# Patient Record
Sex: Male | Born: 1945 | Race: White | Hispanic: No | Marital: Married | Smoking: Never smoker
Health system: Southern US, Community
[De-identification: ages and names within clinical notes are randomized; demographics above are authoritative.]

## PROBLEM LIST (undated history)

## (undated) DIAGNOSIS — N19 Unspecified kidney failure: Secondary | ICD-10-CM

## (undated) DIAGNOSIS — I509 Heart failure, unspecified: Secondary | ICD-10-CM

## (undated) DIAGNOSIS — I10 Essential (primary) hypertension: Secondary | ICD-10-CM

## (undated) DIAGNOSIS — E119 Type 2 diabetes mellitus without complications: Secondary | ICD-10-CM

---

## 2018-06-03 ENCOUNTER — Ambulatory Visit: Payer: Medicare Other | Admitting: Podiatry

## 2018-07-19 ENCOUNTER — Encounter: Payer: Self-pay | Admitting: Podiatry

## 2018-07-19 ENCOUNTER — Ambulatory Visit (INDEPENDENT_AMBULATORY_CARE_PROVIDER_SITE_OTHER): Payer: Medicare Other | Admitting: Podiatry

## 2018-07-19 ENCOUNTER — Other Ambulatory Visit: Payer: Self-pay

## 2018-07-19 DIAGNOSIS — E119 Type 2 diabetes mellitus without complications: Secondary | ICD-10-CM | POA: Diagnosis not present

## 2018-07-19 DIAGNOSIS — M79675 Pain in left toe(s): Secondary | ICD-10-CM | POA: Diagnosis not present

## 2018-07-19 DIAGNOSIS — M79674 Pain in right toe(s): Secondary | ICD-10-CM

## 2018-07-19 DIAGNOSIS — B351 Tinea unguium: Secondary | ICD-10-CM | POA: Diagnosis not present

## 2018-07-19 NOTE — Progress Notes (Signed)
This patient presents to the office with chief complaint of long thick nails and diabetic feet.  This patient  says there  is  no pain and discomfort in his  feet.  This patient says there are long thick painful both big toenails  He has history of injuring both great toenails.  These nails are painful walking and wearing shoes.  Patient has no history of infection or drainage from both feet.  Patient is unable to  self treat his own nails . This patient presents  to the office today for treatment of the  long nails and a foot evaluation due to history of  diabetes.  General Appearance  Alert, conversant and in no acute stress.  Vascular  Dorsalis pedis and posterior tibial  pulses are palpable  bilaterally.  Capillary return is within normal limits  bilaterally. Temperature is within normal limits  bilaterally.  Neurologic  Senn-Weinstein monofilament wire test within normal limits  bilaterally. Muscle power within normal limits bilaterally.  Nails Thick disfigured discolored nails with subungual debris  from hallux to fifth toes bilaterally. No evidence of bacterial infection or drainage bilaterally.  Orthopedic  No limitations of motion of motion feet .  No crepitus or effusions noted.  No bony pathology or digital deformities noted.  Hallux limitus 1st MPJ  B/L.  Skin  normotropic skin with no porokeratosis noted bilaterally.  No signs of infections or ulcers noted.     Onychomycosis  Diabetes with no foot complications  IE  Debride nails x 2.  A diabetic foot exam was performed and there is no evidence of any vascular or neurologic pathology.   RTC 3 months.   Helane Gunther DPM

## 2018-07-23 ENCOUNTER — Encounter

## 2018-07-23 ENCOUNTER — Ambulatory Visit: Payer: Medicare Other | Admitting: Podiatry

## 2018-10-23 ENCOUNTER — Ambulatory Visit: Payer: Medicare Other | Admitting: Podiatry

## 2018-12-20 ENCOUNTER — Ambulatory Visit: Payer: Medicare Other | Admitting: Podiatry

## 2019-03-21 ENCOUNTER — Ambulatory Visit: Payer: Medicare Other | Admitting: Podiatry

## 2021-08-16 ENCOUNTER — Ambulatory Visit (INDEPENDENT_AMBULATORY_CARE_PROVIDER_SITE_OTHER): Payer: Medicare Other | Admitting: Internal Medicine

## 2021-08-16 ENCOUNTER — Encounter: Payer: Self-pay | Admitting: Internal Medicine

## 2021-08-16 VITALS — BP 132/84 | HR 77 | Ht 68.0 in | Wt 209.2 lb

## 2021-08-16 DIAGNOSIS — I4891 Unspecified atrial fibrillation: Secondary | ICD-10-CM

## 2021-08-16 NOTE — Progress Notes (Signed)
?Cardiology Office Note:   ? ?Date:  08/16/2021  ? ?ID:  Leon Dennis, DOB Sep 09, 1945, MRN [id number] ? ?PCP:  Pcp, No ?  ?[facility] Providers ?Cardiologist:  None    ? ?Referring MD: Allie Dimmer, MD  ? ?No chief complaint on file. ?Afib ? ?History of Present Illness:   ? ?Leon Dennis is a 76 y.o. male with a hx of right sided heart failure, CKD Stage 3a, T2DM,  referral for afib ? ?Saw her PCP 2/28 with c/f PNA. Had bloating. His EKG showed afib, which is a new diagnosis. He was started on eliquis.  She also started him on lasix. He has RV failure with significant pulmonary HTN. ? ?Saw cardiology at [facility]. She saw Dr. Dianah Field for afib. No detailed note. ? ?He went to the ED 4/11 and saw his PCP Dr. Raquel Sarna on 4/13. He had LE edema and weight gain of 4 pounds. She the lasix that was started. The lasix showed some improvement. He continued to have abdominal distension. He has persistent DOE. ? ?He comes in today feeling tired and SOB. No orthopnea or PND. Continues to have abdominal edema. Still has pitting edema. This is challenging to manage at home. He is concerned about increasing his lasix with frequent urination. He's on room air. Not in acute distress. ? ? ?No COPD. No lung dx. No PE. He is planned for a sleep study he believes ? ? ? ?[employer] ?08/03/2021 Cxray ?Persistent small right pleural effusion ? ? ?TTE  08/04/2021 ?EF 45-50 ?Moderately enlarged RV ?Severely Dilate RA ?RVSP 65  mmHg  ?Moderately dilated LA ?Mild AS ?Mild MR ?E/e' 18 ? ?07/31/2021 ?CXRAY infiltrates -right mid/lower lung fields ? ?CTA Abd/pelvis  ?CAC ? Small to moderate R pleural effusions. Compressive atelectasis of the right lung. Small ascites ? ?Current Medications: ?Current Outpatient Medications on File Prior to Visit  ?Medication Sig Dispense Refill  ? amLODipine (NORVASC) 10 MG tablet     ? busPIRone (BUSPAR) 5 MG tablet Take by mouth.    ? dapagliflozin propanediol (FARXIGA) 5 MG TABS tablet Take by  mouth.    ? furosemide (LASIX) 40 MG tablet Take 40 mg by mouth daily.    ? glimepiride (AMARYL) 4 MG tablet     ? glucose blood test strip 1 Strip by external route two times daily Dx: E11.65 uncontrolled Type 2 diabetes ?Dispense Freestyle Insulinx test strips    ? hydrALAZINE (APRESOLINE) 25 MG tablet TAKE (1) TABLET TWICE DAILY.    ? lisinopril (ZESTRIL) 40 MG tablet Take 40 mg by mouth daily.    ? magnesium chloride (SLOW-MAG) 64 MG TBEC SR tablet Take by mouth.    ? metoprolol tartrate (LOPRESSOR) 100 MG tablet TAKE (1) TABLET TWICE DAILY.    ? potassium chloride (KLOR-CON M) 10 MEQ tablet Take 10 mEq by mouth daily.    ? ?No current facility-administered medications on file prior to visit.  ? ? ? ?Allergies:   Patient has no known allergies.  ? ?Social History  ? ?Socioeconomic History  ? Marital status: Married  ?  Spouse name: Not on file  ? Number of children: Not on file  ? Years of education: Not on file  ? Highest education level: Not on file  ?Occupational History  ? Not on file  ?Tobacco Use  ? Smoking status: Never  ? Smokeless tobacco: Never  ?Substance and Sexual Activity  ? Alcohol use: Yes  ?  Comment: [email or web address]  ? Drug  use: Never  ? Sexual activity: Not on file  ?Other Topics Concern  ? Not on file  ?Social History Narrative  ? Not on file  ? ?Social Determinants of Health  ? ?Financial Resource Strain: Not on file  ?Food Insecurity: Not on file  ?Transportation Needs: Not on file  ?Physical Activity: Not on file  ?Stress: Not on file  ?Social Connections: Not on file  ?  ? ?Family History: ?The patient's reviewed, not pertinent ? ?ROS:   ?Please see the history of present illness.    ? All other systems reviewed and are negative. ? ?EKGs/Labs/Other Studies Reviewed:   ? ?The following studies were reviewed today: ? ? ?EKG:  EKG is  ordered today.  The ekg ordered today demonstrates  ? ?Afib - rates 70s ? ?Recent Labs: ?No results found for requested labs within last 8760 hours.  ?Recent Lipid  Panel ?No results found for: CHOL, TRIG, HDL, CHOLHDL, VLDL, LDLCALC, LDLDIRECT ? ? ?Risk Assessment/Calculations:   ? ?CHA2DS2-VASc Score = 5  ? This indicates a 7.2% annual risk of stroke. ?The patient's score is based upon: ?CHF History: 1 ?HTN History: 1 ?Diabetes History: 1 ?Stroke History: 0 ?Vascular Disease History: 0 ?Age Score: 2 ?Gender Score: 0 ?  ? ? ?    ? ?Physical Exam:   ? ?VS:  BP 132/84   Pulse 77   Ht 5\' 8"  (1.727 m)   Wt 209 lb 3.2 oz (94.9 kg)   SpO2 92%   BMI 31.81 kg/m?    ? ?Wt Readings from Last 3 Encounters:  ?08/16/21 209 lb 3.2 oz (94.9 kg)  ?  ?Vitals:  ? 08/16/21 1123  ?BP: 132/84  ?Pulse: 77  ?SpO2: 92%  ? ? ? ?GEN: Well nourished, no acute distress ?HEENT: Normal ?NECK: mild elevation JVD; No carotid bruits ?LYMPHATICS: No lymphadenopathy ?CARDIAC: irregularly irregular, no murmurs, rubs, gallops ?RESPIRATORY:  Nl wob, crackles ?ABDOMEN: Soft, non-tender, non-distended ?MUSCULOSKELETAL:  No edema; No deformity  ?SKIN: Warm and dry ?NEUROLOGIC:  Alert and oriented x 3 ?PSYCHIATRIC:  Normal affect  ? ?ASSESSMENT:   ? ?HFpEF: decompensation. He has significant pulmonary HTN with RV dysfunction. Challenging to manage as an outpatient. He's been on a diuretic persistently with some improvement, but still symptomatic w/ gut edema. Could benefit from IV lasix. I recommended that he go to the ED with his wife for admission.   ?- continue current lasix 40 mg daily ? ?Pulmonary HTN: Can consider further w/u with PFTs. Has Group II component. No known lung disease, no hx of PE. Planned for home sleep study per patient.  ? ?Afib: Paroxysmal. New onset. He's been on eliquis since 2/28, no missed doses. I recommend DCCV once he is euvolemic. ?- continue eliquis ?- cont metop ? ?HTN- continue lisinopril 20 mg daily, norvasc 10 mg daily, metop 100 mg tartrate BID, hydralazine 25 mg BID.  ? ?PLAN:   ? ?In order of problems listed above: ? ?Recommend admission, they wanted to decide on the day.   ?Follow up in [place] after admission ? ?   ? ?  ?Medication Adjustments/Labs and Tests Ordered: ?Current medicines are reviewed at length with the patient today.  Concerns regarding medicines are outlined above.  ?Orders Placed This Encounter  ?Procedures  ? EKG 12-Lead  ? ?No orders of the defined types were placed in this encounter. ? ? ?Patient Instructions  ?Medication Instructions:  ?Your Physician recommend you continue on your current medication as directed.   ? ?*  If you need a refill on your cardiac medications before your next appointment, please call your pharmacy* ? ? ?Lab Work: ?None ordered today ? ? ?Testing/Procedures: ?None ordered today ? ? ?Follow-Up: ?At [facility], you and your health needs are our priority.  As part of our continuing mission to provide you with exceptional heart care, we have created designated Provider Care Teams.  These Care Teams include your primary Cardiologist (physician) and Advanced Practice Providers (APPs -  Physician Assistants and Nurse Practitioners) who all work together to provide you with the care you need, when you need it. ? ?We recommend signing up for the patient portal called "MyChart".  Sign up information is provided on this After Visit Summary.  MyChart is used to connect with patients for Virtual Visits (Telemedicine).  Patients are able to view lab/test results, encounter notes, upcoming appointments, etc.  Non-urgent messages can be sent to your provider as well.   ?To learn more about what you can do with MyChart, go to [email or web address].   ? ?Your next appointment:   ?1 month(s) ? ?The format for your next appointment:   ?In Person ? ?Provider: ?Carlyle Dolly, MD   ? ? ?Important Information About Sugar ? ? ? ? ? ?  ? ?Signed, ?Janina Mayo, MD  ?08/16/2021 3:22 PM    ?[place] ?

## 2021-08-16 NOTE — Patient Instructions (Addendum)
Medication Instructions:  ?Your Physician recommend you continue on your current medication as directed.   ? ?*If you need a refill on your cardiac medications before your next appointment, please call your pharmacy* ? ? ?Lab Work: ?None ordered today ? ? ?Testing/Procedures: ?None ordered today ? ? ?Follow-Up: ?At [facility], you and your health needs are our priority.  As part of our continuing mission to provide you with exceptional heart care, we have created designated Provider Care Teams.  These Care Teams include your primary Cardiologist (physician) and Advanced Practice Providers (APPs -  Physician Assistants and Nurse Practitioners) who all work together to provide you with the care you need, when you need it. ? ?We recommend signing up for the patient portal called "MyChart".  Sign up information is provided on this After Visit Summary.  MyChart is used to connect with patients for Virtual Visits (Telemedicine).  Patients are able to view lab/test results, encounter notes, upcoming appointments, etc.  Non-urgent messages can be sent to your provider as well.   ?To learn more about what you can do with MyChart, go to [email or web address].   ? ?Your next appointment:   ?1 month(s) ? ?The format for your next appointment:   ?In Person ? ?Provider: ?Dina Rich, MD   ? ? ?Important Information About Sugar ? ? ? ? ? ? ?

## 2021-08-17 ENCOUNTER — Telehealth: Payer: Self-pay | Admitting: Internal Medicine

## 2021-08-17 NOTE — Telephone Encounter (Signed)
Spoke with bed control- pt placed on list for direct admit. Per bed control pt may get a bed today but is more hopeful for tomorrow. ? ?Called pt's wife, Natalia Leatherwood (ok per DPR) back and gave her this information. Made wife aware that if pt's condition gets worst and they choose to go to the ED then that will negate the direct admit. ED precautions reviewed with wife. Wife will give our office a call in the morning to check on status of bed. Wife verbalizes understanding.  ?

## 2021-08-17 NOTE — Telephone Encounter (Signed)
Follow Up: ? ? ? ? ?Patient's wife called, she said she was waiting to hear something. Please give her a call. ?

## 2021-08-17 NOTE — Telephone Encounter (Signed)
Patient wife called stating she was told yesterday at his visit by Dr. Tereso Newcomer, he is to go to the emergency room tomorrow Thursday 4/20 to be admitted.   She wants to make sure he doesn't need to go through the emergency room process to be admitted.  ?

## 2021-08-18 ENCOUNTER — Inpatient Hospital Stay (HOSPITAL_COMMUNITY)
Admission: AD | Admit: 2021-08-18 | Discharge: 2021-08-22 | DRG: 291 | Disposition: A | Discharge status: Home / Self Care | Payer: Medicare Other | Source: Ambulatory Visit | Attending: Cardiology | Admitting: Cardiology

## 2021-08-18 ENCOUNTER — Inpatient Hospital Stay (HOSPITAL_COMMUNITY): Payer: Medicare Other

## 2021-08-18 DIAGNOSIS — G4733 Obstructive sleep apnea (adult) (pediatric): Secondary | ICD-10-CM | POA: Diagnosis present

## 2021-08-18 DIAGNOSIS — I5031 Acute diastolic (congestive) heart failure: Secondary | ICD-10-CM | POA: Diagnosis present

## 2021-08-18 DIAGNOSIS — E1122 Type 2 diabetes mellitus with diabetic chronic kidney disease: Secondary | ICD-10-CM | POA: Diagnosis present

## 2021-08-18 DIAGNOSIS — I272 Pulmonary hypertension, unspecified: Secondary | ICD-10-CM | POA: Diagnosis not present

## 2021-08-18 DIAGNOSIS — I2721 Secondary pulmonary arterial hypertension: Secondary | ICD-10-CM | POA: Diagnosis present

## 2021-08-18 DIAGNOSIS — I50813 Acute on chronic right heart failure: Secondary | ICD-10-CM

## 2021-08-18 DIAGNOSIS — E876 Hypokalemia: Secondary | ICD-10-CM | POA: Diagnosis present

## 2021-08-18 DIAGNOSIS — I50810 Right heart failure, unspecified: Secondary | ICD-10-CM | POA: Diagnosis present

## 2021-08-18 DIAGNOSIS — I480 Paroxysmal atrial fibrillation: Secondary | ICD-10-CM

## 2021-08-18 DIAGNOSIS — N1831 Chronic kidney disease, stage 3a: Secondary | ICD-10-CM | POA: Diagnosis present

## 2021-08-18 DIAGNOSIS — I4819 Other persistent atrial fibrillation: Secondary | ICD-10-CM | POA: Diagnosis present

## 2021-08-18 DIAGNOSIS — I4891 Unspecified atrial fibrillation: Secondary | ICD-10-CM | POA: Diagnosis not present

## 2021-08-18 DIAGNOSIS — R350 Frequency of micturition: Secondary | ICD-10-CM | POA: Diagnosis present

## 2021-08-18 DIAGNOSIS — F419 Anxiety disorder, unspecified: Secondary | ICD-10-CM | POA: Diagnosis present

## 2021-08-18 DIAGNOSIS — I5082 Biventricular heart failure: Secondary | ICD-10-CM | POA: Diagnosis present

## 2021-08-18 DIAGNOSIS — Z79899 Other long term (current) drug therapy: Secondary | ICD-10-CM

## 2021-08-18 DIAGNOSIS — Z7901 Long term (current) use of anticoagulants: Secondary | ICD-10-CM

## 2021-08-18 DIAGNOSIS — I130 Hypertensive heart and chronic kidney disease with heart failure and stage 1 through stage 4 chronic kidney disease, or unspecified chronic kidney disease: Secondary | ICD-10-CM | POA: Diagnosis present

## 2021-08-18 DIAGNOSIS — I10 Essential (primary) hypertension: Secondary | ICD-10-CM

## 2021-08-18 DIAGNOSIS — G4710 Hypersomnia, unspecified: Secondary | ICD-10-CM | POA: Diagnosis present

## 2021-08-18 DIAGNOSIS — I2781 Cor pulmonale (chronic): Secondary | ICD-10-CM | POA: Diagnosis present

## 2021-08-18 DIAGNOSIS — G4700 Insomnia, unspecified: Secondary | ICD-10-CM | POA: Diagnosis present

## 2021-08-18 DIAGNOSIS — R0609 Other forms of dyspnea: Secondary | ICD-10-CM | POA: Diagnosis not present

## 2021-08-18 DIAGNOSIS — E119 Type 2 diabetes mellitus without complications: Secondary | ICD-10-CM | POA: Diagnosis not present

## 2021-08-18 HISTORY — DX: Type 2 diabetes mellitus without complications: E11.9

## 2021-08-18 LAB — ECHOCARDIOGRAM COMPLETE
Calc EF: 66.6 %
Height: 68 in
S' Lateral: 2.62 cm
Single Plane A2C EF: 54.9 %
Single Plane A4C EF: 74 %
Weight: 3269.86 oz

## 2021-08-18 LAB — CBC WITH DIFFERENTIAL/PLATELET
Abs Immature Granulocytes: 0.02 10*3/uL (ref 0.00–0.07)
Basophils Absolute: 0.1 10*3/uL (ref 0.0–0.1)
Basophils Relative: 1 %
Eosinophils Absolute: 0.1 10*3/uL (ref 0.0–0.5)
Eosinophils Relative: 1 %
HCT: 37.3 % — ABNORMAL LOW (ref 39.0–52.0)
Hemoglobin: 12.5 g/dL — ABNORMAL LOW (ref 13.0–17.0)
Immature Granulocytes: 0 %
Lymphocytes Relative: 15 %
Lymphs Abs: 0.8 10*3/uL (ref 0.7–4.0)
MCH: 29.6 pg (ref 26.0–34.0)
MCHC: 33.5 g/dL (ref 30.0–36.0)
MCV: 88.2 fL (ref 80.0–100.0)
Monocytes Absolute: 0.7 10*3/uL (ref 0.1–1.0)
Monocytes Relative: 13 %
Neutro Abs: 3.8 10*3/uL (ref 1.7–7.7)
Neutrophils Relative %: 70 %
Platelets: 183 10*3/uL (ref 150–400)
RBC: 4.23 MIL/uL (ref 4.22–5.81)
RDW: 14.9 % (ref 11.5–15.5)
WBC: 5.5 10*3/uL (ref 4.0–10.5)
nRBC: 0 % (ref 0.0–0.2)

## 2021-08-18 LAB — COMPREHENSIVE METABOLIC PANEL
ALT: 13 U/L (ref 0–44)
AST: 21 U/L (ref 15–41)
Albumin: 3.5 g/dL (ref 3.5–5.0)
Alkaline Phosphatase: 58 U/L (ref 38–126)
Anion gap: 8 (ref 5–15)
BUN: 21 mg/dL (ref 8–23)
CO2: 24 mmol/L (ref 22–32)
Calcium: 8.7 mg/dL — ABNORMAL LOW (ref 8.9–10.3)
Chloride: 104 mmol/L (ref 98–111)
Creatinine, Ser: 1.52 mg/dL — ABNORMAL HIGH (ref 0.61–1.24)
GFR, Estimated: 47 mL/min — ABNORMAL LOW (ref >60)
Glucose, Bld: 272 mg/dL — ABNORMAL HIGH (ref 70–99)
Potassium: 3.1 mmol/L — ABNORMAL LOW (ref 3.5–5.1)
Sodium: 136 mmol/L (ref 135–145)
Total Bilirubin: 1.2 mg/dL (ref 0.3–1.2)
Total Protein: 6.5 g/dL (ref 6.5–8.1)

## 2021-08-18 LAB — GLUCOSE, CAPILLARY: Glucose-Capillary: 204 mg/dL — ABNORMAL HIGH (ref 70–99)

## 2021-08-18 LAB — TSH: TSH: 2.788 u[IU]/mL (ref 0.350–4.500)

## 2021-08-18 LAB — BRAIN NATRIURETIC PEPTIDE: B Natriuretic Peptide: 1394 pg/mL — ABNORMAL HIGH (ref 0.0–100.0)

## 2021-08-18 LAB — MAGNESIUM: Magnesium: 2 mg/dL (ref 1.7–2.4)

## 2021-08-18 LAB — HEMOGLOBIN A1C
Hgb A1c MFr Bld: 7.3 % — ABNORMAL HIGH (ref 4.8–5.6)
Mean Plasma Glucose: 162.81 mg/dL

## 2021-08-18 LAB — D-DIMER, QUANTITATIVE: D-Dimer, Quant: 2.81 ug/mL-FEU — ABNORMAL HIGH (ref 0.00–0.50)

## 2021-08-18 MED ORDER — ACETAMINOPHEN 325 MG PO TABS: 650.0000 mg | ORAL_TABLET | ORAL | Status: DC | PRN

## 2021-08-18 MED ORDER — FUROSEMIDE 10 MG/ML IJ SOLN
60.0000 mg | Freq: Two times a day (BID) | INTRAMUSCULAR | Status: DC
  Administered 2021-08-18 – 2021-08-22 (×8): 60 mg via INTRAVENOUS
  Filled 2021-08-18 (×8): qty 6

## 2021-08-18 MED ORDER — METOPROLOL TARTRATE 100 MG PO TABS
100.0000 mg | ORAL_TABLET | Freq: Two times a day (BID) | ORAL | Status: DC
  Administered 2021-08-18 – 2021-08-22 (×8): 100 mg via ORAL
  Filled 2021-08-18 (×8): qty 1

## 2021-08-18 MED ORDER — INSULIN ASPART 100 UNIT/ML IJ SOLN
0.0000 [IU] | Freq: Every day | INTRAMUSCULAR | Status: DC
  Administered 2021-08-18: 2 [IU] via SUBCUTANEOUS

## 2021-08-18 MED ORDER — ONDANSETRON HCL 4 MG/2ML IJ SOLN: 4.0000 mg | Freq: Four times a day (QID) | INTRAMUSCULAR | Status: DC | PRN

## 2021-08-18 MED ORDER — MELATONIN 5 MG PO TABS
5.0000 mg | ORAL_TABLET | Freq: Every evening | ORAL | Status: DC | PRN
  Administered 2021-08-18: 5 mg via ORAL
  Filled 2021-08-18 (×3): qty 1

## 2021-08-18 MED ORDER — ACETAMINOPHEN 500 MG PO TABS
1000.0000 mg | ORAL_TABLET | ORAL | Status: DC | PRN
  Administered 2021-08-19 (×2): 1000 mg via ORAL
  Filled 2021-08-18 (×2): qty 2

## 2021-08-18 MED ORDER — BUSPIRONE HCL 5 MG PO TABS
5.0000 mg | ORAL_TABLET | Freq: Three times a day (TID) | ORAL | Status: DC
  Administered 2021-08-18 – 2021-08-22 (×12): 5 mg via ORAL
  Filled 2021-08-18 (×12): qty 1

## 2021-08-18 MED ORDER — AMLODIPINE BESYLATE 10 MG PO TABS
10.0000 mg | ORAL_TABLET | Freq: Every day | ORAL | Status: DC
Start: 2021-08-19 — End: 2021-08-22
  Administered 2021-08-19 – 2021-08-22 (×4): 10 mg via ORAL
  Filled 2021-08-18 (×4): qty 1

## 2021-08-18 MED ORDER — INSULIN ASPART 100 UNIT/ML IJ SOLN
0.0000 [IU] | Freq: Three times a day (TID) | INTRAMUSCULAR | Status: DC
  Administered 2021-08-19: 2 [IU] via SUBCUTANEOUS
  Administered 2021-08-19: 5 [IU] via SUBCUTANEOUS
  Administered 2021-08-20 (×2): 3 [IU] via SUBCUTANEOUS
  Administered 2021-08-21: 2 [IU] via SUBCUTANEOUS
  Administered 2021-08-21: 8 [IU] via SUBCUTANEOUS
  Administered 2021-08-22: 2 [IU] via SUBCUTANEOUS
  Administered 2021-08-22: 5 [IU] via SUBCUTANEOUS

## 2021-08-18 MED ORDER — POTASSIUM CHLORIDE CRYS ER 20 MEQ PO TBCR
40.0000 meq | EXTENDED_RELEASE_TABLET | Freq: Two times a day (BID) | ORAL | Status: DC
  Administered 2021-08-18 – 2021-08-22 (×8): 40 meq via ORAL
  Filled 2021-08-18 (×8): qty 2

## 2021-08-18 MED ORDER — HYDRALAZINE HCL 25 MG PO TABS
25.0000 mg | ORAL_TABLET | Freq: Two times a day (BID) | ORAL | Status: DC
  Administered 2021-08-18 – 2021-08-20 (×5): 25 mg via ORAL
  Filled 2021-08-18 (×5): qty 1

## 2021-08-18 MED ORDER — DAPAGLIFLOZIN PROPANEDIOL 5 MG PO TABS
5.0000 mg | ORAL_TABLET | Freq: Every day | ORAL | Status: DC
  Administered 2021-08-19 – 2021-08-22 (×4): 5 mg via ORAL
  Filled 2021-08-18 (×4): qty 1

## 2021-08-18 MED ORDER — APIXABAN 5 MG PO TABS
5.0000 mg | ORAL_TABLET | Freq: Two times a day (BID) | ORAL | Status: DC
  Administered 2021-08-18 – 2021-08-21 (×7): 5 mg via ORAL
  Filled 2021-08-18 (×7): qty 1

## 2021-08-18 MED ORDER — LISINOPRIL 20 MG PO TABS
40.0000 mg | ORAL_TABLET | Freq: Every day | ORAL | Status: DC
  Administered 2021-08-19 – 2021-08-22 (×4): 40 mg via ORAL
  Filled 2021-08-18 (×4): qty 2

## 2021-08-18 NOTE — TOC Progression Note (Signed)
Transition of Care (TOC) - Progression Note  ? ? ?Patient Details  ?Name: Leon Dennis ?MRN: [id number] ?Date of Birth: Sep 30, 1945 ? ?Transition of Care (TOC) CM/SW Contact  ?Leone Haven, RN ?Phone Number: ?08/18/2021, 3:04 PM ? ?Clinical Narrative:    ? from home with wife, CHF, afib ,pulmonary HTN, conts on iv lasix, pta eliquis. TOC will continue to follow for dc needs.  ? ? ?  ?  ? ?Expected Discharge Plan and Services ?  ?  ?  ?  ?  ?                ?  ?  ?  ?  ?  ?  ?  ?  ?  ?  ? ? ?Social Determinants of Health (SDOH) Interventions ?  ? ?Readmission Risk Interventions ?   ? View : No data to display.  ?  ?  ?  ? ? ?

## 2021-08-18 NOTE — H&P (Addendum)
?Cardiology Admission History and Physical:  ? ?Patient ID: Leon Dennis ?MRN: [id number]; DOB: [birth year]  ? ?Admission date: 08/18/2021 ? ?PCP:  Pcp, No ?  ?[facility] Providers ?Cardiologist:  Janina Mayo, MD  ? ? ?Chief Complaint:  Shortness of Breath, atrial fibrillation  ? ?Patient Profile:  ? ?Leon Dennis is a 76 y.o. male with history of right sided heart failure, paroxysmal atrial fibrillation, CKD stage IIIa, HTN, Type 2 DM who is being seen 08/18/2021 for the evaluation of heart failure. ? ?History of Present Illness:  ? ?Leon Dennis is a 76 year old male with above medical history who is followed by Dr. Harl Bowie. Per chart review, patient was incidentally noted to be in atrial fibrillation at a visit with his PCP on 06/28/21. He denied any symptoms of afib at that time. Patient was started on eliquis and referred to [facility] for cardiology evaluation. He was seen on 07/13/21, had an echocardiogram that showed EF 45-50%, a moderately enlarged right ventricle, mildly decreased RV systolic function, pulmonary HTN, moderately dilated left atrium, severely dilated right atrium, mild MRV, moderately elevated pulmonary artery systolic pressure. Patient was referred to [facility] for further management. Prior to follow up, patient was seen in the ED on 4/11 and by his PCP on 4/13. Both times, patient complained of LE edema, weight gain, DOE, abdominal distension. Patient was started on lasix.   ? ?Patient was seen by Dr. Harl Bowie on 4/18. At that appointment, patient was experiencing SOB, abdominal edema, BLE edema. Patient was concerned about increasing his lasix dose due to frequent urination.  Thought that patient could benefit from IV lasix, so he was directly admitted to [facility] on 08/18/21.  ? ?On interview, patient reports that he had never had any issues with his heart prior to this year. Had an URI in February. When he went to his PCP for evaluation on 2/28, patient was noted to  be in atrial fibrillation. Denied any palpitations, dizziness, lightheadedness, or chest pain at that time. Was started on eliquis and has been compliant with his medications. Since Feburary, patient has noticed that he has been more SOB on exertion. Also has orthopnea. Denies PND. Has experienced weight gain, abdominal distention, and lower extremity edema. Denies ever experiencing any chest pain. Patient has a hard time sleeping at night due to anxiety, and is currently being evaluated for OSA. Patient denies any prior history of heart issues. Denies any history of lung disease, COPD. No smoking history.  ? ? ?No past medical history on file. ? ?No past surgical history on file.  ? ?Medications Prior to Admission: ?Prior to Admission medications   ?Medication Sig Start Date End Date Taking? Authorizing Provider  ?apixaban (ELIQUIS) 5 MG TABS tablet Take 5 mg by mouth 2 (two) times daily.   Yes [provider]  ?amLODipine (NORVASC) 10 MG tablet Take 10 mg by mouth daily. 07/16/18   [provider]  ?busPIRone (BUSPAR) 5 MG tablet Take 5 mg by mouth 3 (three) times daily. 08/04/21   [provider]  ?dapagliflozin propanediol (FARXIGA) 5 MG TABS tablet Take 5 mg by mouth daily. 08/11/21   [provider]  ?furosemide (LASIX) 40 MG tablet Take 40 mg by mouth daily. 08/05/21   [provider]  ?glimepiride (AMARYL) 4 MG tablet Take 4 mg by mouth in the morning and at bedtime. 07/05/18   [provider]  ?hydrALAZINE (APRESOLINE) 25 MG tablet Take 25 mg by mouth in  the morning and at bedtime. 02/25/18   [provider]  ?lisinopril (ZESTRIL) 40 MG tablet Take 40 mg by mouth daily. 08/11/21   [provider]  ?magnesium chloride (SLOW-MAG) 64 MG TBEC SR tablet Take 64 mg by mouth daily. 07/28/21   [provider]  ?metoprolol tartrate (LOPRESSOR) 100 MG tablet Take 100 mg by mouth 2 (two) times daily. 11/26/17   [provider]  ?potassium  chloride (KLOR-CON M) 10 MEQ tablet Take 10 mEq by mouth daily. 07/28/21   [provider]  ?  ? ?Allergies:   No Known Allergies ? ?Social History:   ?Social History  ? ?Socioeconomic History  ? Marital status: Married  ?  Spouse name: Not on file  ? Number of children: Not on file  ? Years of education: Not on file  ? Highest education level: Not on file  ?Occupational History  ? Not on file  ?Tobacco Use  ? Smoking status: Never  ? Smokeless tobacco: Never  ?Substance and Sexual Activity  ? Alcohol use: Yes  ?  Comment: [email or web address]  ? Drug use: Never  ? Sexual activity: Not on file  ?Other Topics Concern  ? Not on file  ?Social History Narrative  ? Not on file  ? ?Social Determinants of Health  ? ?Financial Resource Strain: Not on file  ?Food Insecurity: Not on file  ?Transportation Needs: Not on file  ?Physical Activity: Not on file  ?Stress: Not on file  ?Social Connections: Not on file  ?Intimate Partner Violence: Not on file  ?  ?Family History:   ?The patient's family history is not on file.   ? ?ROS:  ?Please see the history of present illness.  ?All other ROS reviewed and negative.    ? ?Physical Exam/Data:  ? ?Vitals:  ? 08/18/21 1149  ?BP: (!) 152/89  ?Pulse: 88  ?Resp: 18  ?Temp: 98.9 ?F (37.2 ?C)  ?TempSrc: Oral  ?SpO2: 92%  ?Weight: 92.7 kg  ?Height: 5\' 8"  (1.727 m)  ? ?No intake or output data in the 24 hours ending 08/18/21 1203 ? ?  08/18/2021  ? 11:49 AM 08/16/2021  ? 11:23 AM  ?Last 3 Weights  ?Weight (lbs) 204 lb 5.9 oz 209 lb 3.2 oz  ?Weight (kg) 92.7 kg 94.892 kg  ?   ?Body mass index is 31.07 kg/m?.  ?General:  Well nourished, well developed elderly male in no acute distress. Laying comfortably in the bed  ?HEENT: normal ?Neck:  JVD to midneck  ?Vascular: Radial pulses 2+ bilaterally   ?Cardiac:  normal S1, S2; irregular rate and rhythm; no murmur  ?Lungs:  clear to auscultation bilaterally, no wheezing, rhonchi or rales  ?Abd: distended, fluid wave present, nontender to palpation  ?Ext:  3+ pitting edema to the knees in BLE  ?Musculoskeletal:  No deformities, BUE and BLE strength normal and equal ?Skin: warm and dry  ?Neuro:  CNs 2-12 intact, no focal abnormalities noted ?Psych:  Normal affect  ? ? ?EKG:  The ECG that was done 4/20 was personally reviewed and demonstrates atrial fibrillation, HR 82 ? ?Relevant CV Studies: ? ?Echocardiogram 08/07/21 ? 1. Overall left ventricular ejection fraction is estimated at 45 to 50%. ?2. Moderately enlarged right ventricle. ?3. Mildly decreased global left ventricular systolic function. ?4. Moderately reduced RV systolic function. ?5. Pulmonary hypertension. ?6. Moderately dilated left atrium. ?7. Severely dilated right atrium. ?8. Mild mitral valve regurgitation. ?9. Mild aortic stenosis. ?10. Moderately elevated pulmonary artery systolic pressure. ?  11. Elevated left ventricular end diastolic pressure by tissue Doppler. ? ?Laboratory Data: ? ?High Sensitivity Troponin:  No results for input(s): TROPONINIHS in the last 720 hours.    ?ChemistryNo results for input(s): NA, K, CL, CO2, GLUCOSE, BUN, CREATININE, CALCIUM, MG, GFRNONAA, GFRAA, ANIONGAP in the last 168 hours.  ?No results for input(s): PROT, ALBUMIN, AST, ALT, ALKPHOS, BILITOT in the last 168 hours. ?Lipids No results for input(s): CHOL, TRIG, HDL, LABVLDL, LDLCALC, CHOLHDL in the last 168 hours. ?HematologyNo results for input(s): WBC, RBC, HGB, HCT, MCV, MCH, MCHC, RDW, PLT in the last 168 hours. ?Thyroid No results for input(s): TSH, FREET4 in the last 168 hours. ?BNPNo results for input(s): BNP, PROBNP in the last 168 hours.  ?DDimer No results for input(s): DDIMER in the last 168 hours. ? ? ?Radiology/Studies:  ?No results found. ? ? ?Assessment and Plan:  ? ?HFmEF  ?Right Sided Heart Failure  ?Pulmonary HTN  ?- Recent echocardiogram showed moderately elevated pulmonary artery  HTN with RV dysfunction.  RA Pressure of 15 mmHg, RVSP/PASP of 65.7 mmHg  ?- CT chest/abdomen/pelvis on 4/5 showed mild  cardiomegaly with primarily right heart distension ?- CXR with stable small right pleural effusion  ?- Patient volume overloaded on exam with abdominal distention, lower extremity edema, JVD. Lungs clear

## 2021-08-18 NOTE — Progress Notes (Signed)
@  2120 Dr. Aniceto Boss, on-call for attending, text-paged pt's D-Dimer results from earlier in the day. MD promptly returned page confirming receipt. No orders received. Pt resting comfortably on RA w/o ShOB or increased WOB. Will continue to monitor. ?

## 2021-08-19 DIAGNOSIS — I2721 Secondary pulmonary arterial hypertension: Secondary | ICD-10-CM | POA: Diagnosis not present

## 2021-08-19 DIAGNOSIS — I10 Essential (primary) hypertension: Secondary | ICD-10-CM | POA: Diagnosis not present

## 2021-08-19 DIAGNOSIS — I50813 Acute on chronic right heart failure: Secondary | ICD-10-CM | POA: Diagnosis not present

## 2021-08-19 LAB — COMPREHENSIVE METABOLIC PANEL
ALT: 12 U/L (ref 0–44)
AST: 18 U/L (ref 15–41)
Albumin: 3.5 g/dL (ref 3.5–5.0)
Alkaline Phosphatase: 59 U/L (ref 38–126)
Anion gap: 10 (ref 5–15)
BUN: 18 mg/dL (ref 8–23)
CO2: 23 mmol/L (ref 22–32)
Calcium: 8.9 mg/dL (ref 8.9–10.3)
Chloride: 104 mmol/L (ref 98–111)
Creatinine, Ser: 1.24 mg/dL (ref 0.61–1.24)
GFR, Estimated: >60 mL/min (ref >60)
Glucose, Bld: 89 mg/dL (ref 70–99)
Potassium: 2.9 mmol/L — ABNORMAL LOW (ref 3.5–5.1)
Sodium: 137 mmol/L (ref 135–145)
Total Bilirubin: 1.5 mg/dL — ABNORMAL HIGH (ref 0.3–1.2)
Total Protein: 6.7 g/dL (ref 6.5–8.1)

## 2021-08-19 LAB — GLUCOSE, CAPILLARY
Glucose-Capillary: 93 mg/dL (ref 70–99)
Glucose-Capillary: 203 mg/dL — ABNORMAL HIGH (ref 70–99)
Glucose-Capillary: 143 mg/dL — ABNORMAL HIGH (ref 70–99)

## 2021-08-19 LAB — LIPID PANEL
Cholesterol: 109 mg/dL (ref 0–200)
HDL: 47 mg/dL (ref >40)
LDL Cholesterol: 52 mg/dL (ref 0–99)
Total CHOL/HDL Ratio: 2.3 RATIO
Triglycerides: 50 mg/dL (ref <150)
VLDL: 10 mg/dL (ref 0–40)

## 2021-08-19 MED ORDER — MAGNESIUM HYDROXIDE 400 MG/5ML PO SUSP
15.0000 mL | Freq: Every day | ORAL | Status: DC | PRN
  Administered 2021-08-20: 15 mL via ORAL
  Filled 2021-08-19 (×3): qty 30

## 2021-08-19 MED ORDER — SALINE SPRAY 0.65 % NA SOLN
1.0000 | NASAL | Status: DC | PRN
  Administered 2021-08-19: 1 via NASAL
  Filled 2021-08-19: qty 44

## 2021-08-19 MED ORDER — TRIAMCINOLONE ACETONIDE 55 MCG/ACT NA AERO
2.0000 | INHALATION_SPRAY | Freq: Every day | NASAL | Status: DC | PRN
  Administered 2021-08-19: 2 via NASAL
  Filled 2021-08-19: qty 21.6

## 2021-08-19 NOTE — Progress Notes (Addendum)
? ?Progress Note ? ?Patient Name: Leon Dennis ?Date of Encounter: 08/19/2021 ? ?[facility] Cardiologist: Janina Mayo, MD  ? ?Subjective  ? ?Breathing better, but still not at baseline, has been compliant w/ Eliquis, for DCCV on Monday, 04/24 ? ?Inpatient Medications  ?  ?Scheduled Meds: ? amLODipine  10 mg Oral Daily  ? apixaban  5 mg Oral BID  ? busPIRone  5 mg Oral TID  ? dapagliflozin propanediol  5 mg Oral Daily  ? furosemide  60 mg Intravenous BID  ? hydrALAZINE  25 mg Oral BID  ? insulin aspart  0-15 Units Subcutaneous TID WC  ? insulin aspart  0-5 Units Subcutaneous QHS  ? lisinopril  40 mg Oral Daily  ? metoprolol tartrate  100 mg Oral BID  ? potassium chloride  40 mEq Oral BID  ? ?Continuous Infusions: ? ?PRN Meds: ?acetaminophen, acetaminophen, melatonin, ondansetron (ZOFRAN) IV, sodium chloride, triamcinolone  ? ?Vital Signs  ?  ?Vitals:  ? 08/18/21 1945 08/18/21 2345 08/19/21 0610 08/19/21 0700  ?BP: (!) 153/89 (!) 156/92 (!) 150/87   ?Pulse: 86 68 86   ?Resp: 16 20 14    ?Temp: 97.8 ?F (36.6 ?C) 97.7 ?F (36.5 ?C) (!) 96.9 ?F (36.1 ?C)   ?TempSrc: Oral Oral Oral   ?SpO2: 93% 92% 94%   ?Weight:    90.6 kg  ?Height:      ? ? ?Intake/Output Summary (Last 24 hours) at 08/19/2021 1029 ?Last data filed at 08/19/2021 0800 ?Gross per 24 hour  ?Intake 1074 ml  ?Output 2725 ml  ?Net -1651 ml  ? ? ?  08/19/2021  ?  7:00 AM 08/18/2021  ? 11:49 AM 08/16/2021  ? 11:23 AM  ?Last 3 Weights  ?Weight (lbs) 199 lb 11.8 oz 204 lb 5.9 oz 209 lb 3.2 oz  ?Weight (kg) 90.6 kg 92.7 kg 94.892 kg  ?   ? ?Telemetry  ?  ?Atrial fib, rate generally controlled - Personally Reviewed ? ?ECG  ?  ?Atrial fib, HR 75, low voltage QRS, LAFB (no change from 04/18) - Personally Reviewed ? ?Physical Exam  ? ?GEN: No acute distress.   ?Neck: JVD 10 cm ?Cardiac: IRRR, no murmurs, rubs, or gallops.  ?Respiratory: slightly decreased BS bases bilaterally. ?GI: firm, nontender, non-distended  ?MS: 1-2+ edema; No deformity. ?Neuro:  Nonfocal   ?Psych: Normal affect  ? ?Labs  ?  ?High Sensitivity Troponin:  No results for input(s): TROPONINIHS in the last 720 hours.   ?Chemistry ?Recent Labs  ?Lab 08/18/21 ?1454 08/19/21 ?0701  ?NA 136 137  ?K 3.1* 2.9*  ?CL 104 104  ?CO2 24 23  ?GLUCOSE 272* 89  ?BUN 21 18  ?CREATININE 1.52* 1.24  ?CALCIUM 8.7* 8.9  ?MG 2.0  --   ?PROT 6.5 6.7  ?ALBUMIN 3.5 3.5  ?AST 21 18  ?ALT 13 12  ?ALKPHOS 58 59  ?BILITOT 1.2 1.5*  ?GFRNONAA 47* >60  ?ANIONGAP 8 10  ?  ?Lipids  ?Recent Labs  ?Lab 08/19/21 ?0701  ?CHOL 109  ?TRIG 50  ?HDL 47  ?[place] 52  ?CHOLHDL 2.3  ?  ?Hematology ?Recent Labs  ?Lab 08/18/21 ?1454  ?WBC 5.5  ?RBC 4.23  ?HGB 12.5*  ?HCT 37.3*  ?MCV 88.2  ?MCH 29.6  ?MCHC 33.5  ?RDW 14.9  ?PLT 183  ? ?Thyroid  ?Recent Labs  ?Lab 08/18/21 ?1454  ?TSH 2.788  ?  ?BNP ?Recent Labs  ?Lab 08/18/21 ?1454  ?BNP 1,394.0*  ?  ?DDimer  ?  Recent Labs  ?Lab 08/18/21 ?1454  ?DDIMER 2.81*  ?  ? ?Radiology  ?  ?DG CHEST PORT 1 VIEW ? ?Result Date: 08/18/2021 ?CLINICAL DATA:  Shortness of breath EXAM: PORTABLE CHEST 1 VIEW COMPARISON:  Chest x-ray dated April levin 2023 FINDINGS: Cardiac and mediastinal contours are unchanged. Nodular opacity of the right costophrenic angle, likely due to bone island when compared with recent CT. No focal consolidation. Stable small right pleural effusion. IMPRESSION: No focal consolidation.  Stable small right pleural effusion. Electronically Signed   By: Yetta Glassman M.D.   On: 08/18/2021 13:11  ? ?ECHOCARDIOGRAM COMPLETE ? ?Result Date: 08/18/2021 ?   ECHOCARDIOGRAM REPORT   Patient Name:   Leon Dennis Date of Exam: 08/18/2021 Medical Rec #:  [id number]      Height:       68.0 in Accession #:    [id number]     Weight:       204.4 lb Date of Birth:  25-Aug-1945      BSA:          2.063 m? Patient Age:    85 years       BP:           151/89 mmHg Patient Gender: M              HR:           95 bpm. Exam Location:  Inpatient Procedure: 2D Echo, Cardiac Doppler and Color Doppler Indications:     Dyspnea  History:        Patient has no prior history of Echocardiogram examinations.  Sonographer:    [place] Referring Phys: [id number] Margie Billet  Sonographer Comments: Technically difficult study due to poor echo windows. IMPRESSIONS  1. Left ventricular ejection fraction, by estimation, is 55 to 60%. The left ventricle has normal function. The left ventricle has no regional wall motion abnormalities. There is mild concentric left ventricular hypertrophy. Left ventricular diastolic parameters are indeterminate.  2. Right ventricular systolic function is mildly to moderately reduced. The right ventricular size is moderatly enlarged. RVSP Moderately increased 51 mm Hg.  3. Left atrial size was severely dilated.  4. Right atrial size was moderately dilated.  5. The mitral valve is normal in structure. No evidence of mitral valve regurgitation. No evidence of mitral stenosis.  6. Tricuspid valve regurgitation is moderate.  7. The aortic valve is tricuspid. Aortic valve regurgitation is not visualized. Aortic valve sclerosis/calcification is present, without any evidence of aortic stenosis. Comparison(s): No prior Echocardiogram. FINDINGS  Left Ventricle: Left ventricular ejection fraction, by estimation, is 55 to 60%. The left ventricle has normal function. The left ventricle has no regional wall motion abnormalities. The left ventricular internal cavity size was normal in size. There is  mild concentric left ventricular hypertrophy. Left ventricular diastolic parameters are indeterminate. Right Ventricle: The right ventricular size is moderately enlarged. No increase in right ventricular wall thickness. Right ventricular systolic function is mildly reduced. There is moderately elevated pulmonary artery systolic pressure. The tricuspid regurgitant velocity is 3.45 m/s, and with an assumed right atrial pressure of 3 mmHg, the estimated right ventricular systolic pressure is 123XX123 mmHg. Left Atrium: Left  atrial size was severely dilated. Right Atrium: Right atrial size was moderately dilated. Pericardium: There is no evidence of pericardial effusion. Mitral Valve: The mitral valve is normal in structure. No evidence of mitral valve regurgitation. No evidence of mitral valve stenosis. Tricuspid Valve: The tricuspid valve is normal  in structure. Tricuspid valve regurgitation is moderate . No evidence of tricuspid stenosis. Aortic Valve: The aortic valve is tricuspid. Aortic valve regurgitation is not visualized. Aortic valve sclerosis/calcification is present, without any evidence of aortic stenosis. Pulmonic Valve: The pulmonic valve was normal in structure. Pulmonic valve regurgitation is trivial. No evidence of pulmonic stenosis. Aorta: The aortic root and ascending aorta are structurally normal, with no evidence of dilitation. IAS/Shunts: No atrial level shunt detected by color flow Doppler.  LEFT VENTRICLE PLAX 2D LVIDd:         4.54 cm LVIDs:         2.62 cm LV PW:         1.37 cm LV IVS:        1.09 cm LVOT diam:     1.80 cm LV SV:         38 LV SV Index:   18 LVOT Area:     2.54 cm?  LV Volumes (MOD) LV vol d, MOD A2C: 53.7 ml LV vol d, MOD A4C: 84.0 ml LV vol s, MOD A2C: 24.2 ml LV vol s, MOD A4C: 21.8 ml LV SV MOD A2C:     29.5 ml LV SV MOD A4C:     84.0 ml LV SV MOD BP:      46.7 ml RIGHT VENTRICLE RV Basal diam:  4.40 cm RV Mid diam:    3.14 cm RV S prime:     7.40 cm/s TAPSE (M-mode): 0.8 cm LEFT ATRIUM             Index        RIGHT ATRIUM           Index LA diam:        4.90 cm 2.38 cm/m?   RA Area:     26.90 cm? LA Vol (A2C):   53.6 ml 25.99 ml/m?  RA Volume:   96.20 ml  46.64 ml/m? LA Vol (A4C):   99.0 ml 48.00 ml/m? LA Biplane Vol: 72.1 ml 34.96 ml/m?  AORTIC VALVE             PULMONIC VALVE LVOT Vmax:   88.80 cm/s  PV Vmax:       0.74 m/s LVOT Vmean:  61.200 cm/s PV Peak grad:  2.2 mmHg LVOT VTI:    0.148 m  AORTA Ao Root diam: 2.90 cm Ao Asc diam:  3.00 cm MV E velocity: 110.00 cm/s  TRICUSPID  VALVE                             TR Peak grad:   47.6 mmHg                             TR Vmax:        345.00 cm/s                              SHUNTS                             Systemic VTI:  0.15 m

## 2021-08-20 DIAGNOSIS — I5031 Acute diastolic (congestive) heart failure: Secondary | ICD-10-CM

## 2021-08-20 DIAGNOSIS — I2721 Secondary pulmonary arterial hypertension: Secondary | ICD-10-CM | POA: Diagnosis not present

## 2021-08-20 LAB — COMPREHENSIVE METABOLIC PANEL
ALT: 12 U/L (ref 0–44)
AST: 22 U/L (ref 15–41)
Albumin: 3.7 g/dL (ref 3.5–5.0)
Alkaline Phosphatase: 61 U/L (ref 38–126)
Anion gap: 9 (ref 5–15)
BUN: 22 mg/dL (ref 8–23)
CO2: 26 mmol/L (ref 22–32)
Calcium: 9.1 mg/dL (ref 8.9–10.3)
Chloride: 102 mmol/L (ref 98–111)
Creatinine, Ser: 1.39 mg/dL — ABNORMAL HIGH (ref 0.61–1.24)
GFR, Estimated: 53 mL/min — ABNORMAL LOW (ref >60)
Glucose, Bld: 118 mg/dL — ABNORMAL HIGH (ref 70–99)
Potassium: 3.4 mmol/L — ABNORMAL LOW (ref 3.5–5.1)
Sodium: 137 mmol/L (ref 135–145)
Total Bilirubin: 1.7 mg/dL — ABNORMAL HIGH (ref 0.3–1.2)
Total Protein: 6.8 g/dL (ref 6.5–8.1)

## 2021-08-20 LAB — GLUCOSE, CAPILLARY
Glucose-Capillary: 111 mg/dL — ABNORMAL HIGH (ref 70–99)
Glucose-Capillary: 193 mg/dL — ABNORMAL HIGH (ref 70–99)
Glucose-Capillary: 189 mg/dL — ABNORMAL HIGH (ref 70–99)
Glucose-Capillary: 176 mg/dL — ABNORMAL HIGH (ref 70–99)

## 2021-08-20 MED ORDER — POTASSIUM CHLORIDE CRYS ER 20 MEQ PO TBCR
40.0000 meq | EXTENDED_RELEASE_TABLET | Freq: Once | ORAL | Status: AC
  Administered 2021-08-20: 40 meq via ORAL
  Filled 2021-08-20: qty 2

## 2021-08-20 NOTE — Progress Notes (Signed)
? ?Progress Note ? ?Patient Name: Leon Dennis ?Date of Encounter: 08/20/2021 ? ?Primary Cardiologist:   Janina Mayo, MD ? ? ?Subjective  ? ?He is breathing better but not at baseline  ? ?Inpatient Medications  ?  ?Scheduled Meds: ? amLODipine  10 mg Oral Daily  ? apixaban  5 mg Oral BID  ? busPIRone  5 mg Oral TID  ? dapagliflozin propanediol  5 mg Oral Daily  ? furosemide  60 mg Intravenous BID  ? hydrALAZINE  25 mg Oral BID  ? insulin aspart  0-15 Units Subcutaneous TID WC  ? insulin aspart  0-5 Units Subcutaneous QHS  ? lisinopril  40 mg Oral Daily  ? metoprolol tartrate  100 mg Oral BID  ? potassium chloride  40 mEq Oral BID  ? ?Continuous Infusions: ? ?PRN Meds: ?acetaminophen, acetaminophen, magnesium hydroxide, melatonin, ondansetron (ZOFRAN) IV, sodium chloride, triamcinolone  ? ?Vital Signs  ?  ?Vitals:  ? 08/19/21 2035 08/19/21 2213 08/20/21 0005 08/20/21 0442  ?BP: (!) 152/93 (!) 142/75 137/83 (!) 150/91  ?Pulse: 90 79 90 86  ?Resp: 20  19 19   ?Temp: 97.6 ?F (36.4 ?C)  97.6 ?F (36.4 ?C) (!) 97.3 ?F (36.3 ?C)  ?TempSrc: Oral  Oral Oral  ?SpO2: 93%  94% 95%  ?Weight:    88.5 kg  ?Height:      ? ? ?Intake/Output Summary (Last 24 hours) at 08/20/2021 0737 ?Last data filed at 08/20/2021 0444 ?Gross per 24 hour  ?Intake 814 ml  ?Output 3375 ml  ?Net -2561 ml  ? ?Filed Weights  ? 08/18/21 1149 08/19/21 0700 08/20/21 0442  ?Weight: 92.7 kg 90.6 kg 88.5 kg  ? ? ?Telemetry  ?  ?Atrial fib  - Personally Reviewed ? ?ECG  ?  ?NA - Personally Reviewed ? ?Physical Exam  ? ?GEN: No acute distress.   ?Neck: No  JVD ?Cardiac: Irregular RR, no murmurs, rubs, or gallops.  ?Respiratory: Bilateral basilar crackles ?GI: Soft, nontender, non-distended  ?MS:    Moderate edema to the thigh; No deformity. ?Neuro:  Nonfocal  ?Psych: Normal affect  ? ?Labs  ?  ?Chemistry ?Recent Labs  ?Lab 08/18/21 ?1454 08/19/21 ?0701 08/20/21 ?0324  ?NA 136 137 137  ?K 3.1* 2.9* 3.4*  ?CL 104 104 102  ?CO2 24 23 26   ?GLUCOSE 272* 89 118*  ?BUN  21 18 22   ?CREATININE 1.52* 1.24 1.39*  ?CALCIUM 8.7* 8.9 9.1  ?PROT 6.5 6.7 6.8  ?ALBUMIN 3.5 3.5 3.7  ?AST 21 18 22   ?ALT 13 12 12   ?ALKPHOS 58 59 61  ?BILITOT 1.2 1.5* 1.7*  ?GFRNONAA 47* >60 53*  ?ANIONGAP 8 10 9   ?  ? ?Hematology ?Recent Labs  ?Lab 08/18/21 ?1454  ?WBC 5.5  ?RBC 4.23  ?HGB 12.5*  ?HCT 37.3*  ?MCV 88.2  ?MCH 29.6  ?MCHC 33.5  ?RDW 14.9  ?PLT 183  ? ? ?Cardiac EnzymesNo results for input(s): TROPONINI in the last 168 hours. No results for input(s): TROPIPOC in the last 168 hours.  ? ?BNP ?Recent Labs  ?Lab 08/18/21 ?1454  ?BNP 1,394.0*  ?  ? ?DDimer  ?Recent Labs  ?Lab 08/18/21 ?1454  ?DDIMER 2.81*  ?  ? ?Radiology  ?  ?DG CHEST PORT 1 VIEW ? ?Result Date: 08/18/2021 ?CLINICAL DATA:  Shortness of breath EXAM: PORTABLE CHEST 1 VIEW COMPARISON:  Chest x-ray dated April levin 2023 FINDINGS: Cardiac and mediastinal contours are unchanged. Nodular opacity of the right costophrenic angle, likely due to  bone island when compared with recent CT. No focal consolidation. Stable small right pleural effusion. IMPRESSION: No focal consolidation.  Stable small right pleural effusion. Electronically Signed   By: Yetta Glassman M.D.   On: 08/18/2021 13:11  ? ?ECHOCARDIOGRAM COMPLETE ? ?Result Date: 08/18/2021 ?   ECHOCARDIOGRAM REPORT   Patient Name:   Leon Dennis Date of Exam: 08/18/2021 Medical Rec #:  [id number]      Height:       68.0 in Accession #:    [id number]     Weight:       204.4 lb Date of Birth:  Feb 11, 1946      BSA:          2.063 m? Patient Age:    26 years       BP:           151/89 mmHg Patient Gender: M              HR:           95 bpm. Exam Location:  Inpatient Procedure: 2D Echo, Cardiac Doppler and Color Doppler Indications:    Dyspnea  History:        Patient has no prior history of Echocardiogram examinations.  Sonographer:    [place] Referring Phys: [id number] Margie Billet  Sonographer Comments: Technically difficult study due to poor echo windows. IMPRESSIONS  1. Left  ventricular ejection fraction, by estimation, is 55 to 60%. The left ventricle has normal function. The left ventricle has no regional wall motion abnormalities. There is mild concentric left ventricular hypertrophy. Left ventricular diastolic parameters are indeterminate.  2. Right ventricular systolic function is mildly to moderately reduced. The right ventricular size is moderatly enlarged. RVSP Moderately increased 51 mm Hg.  3. Left atrial size was severely dilated.  4. Right atrial size was moderately dilated.  5. The mitral valve is normal in structure. No evidence of mitral valve regurgitation. No evidence of mitral stenosis.  6. Tricuspid valve regurgitation is moderate.  7. The aortic valve is tricuspid. Aortic valve regurgitation is not visualized. Aortic valve sclerosis/calcification is present, without any evidence of aortic stenosis. Comparison(s): No prior Echocardiogram. FINDINGS  Left Ventricle: Left ventricular ejection fraction, by estimation, is 55 to 60%. The left ventricle has normal function. The left ventricle has no regional wall motion abnormalities. The left ventricular internal cavity size was normal in size. There is  mild concentric left ventricular hypertrophy. Left ventricular diastolic parameters are indeterminate. Right Ventricle: The right ventricular size is moderately enlarged. No increase in right ventricular wall thickness. Right ventricular systolic function is mildly reduced. There is moderately elevated pulmonary artery systolic pressure. The tricuspid regurgitant velocity is 3.45 m/s, and with an assumed right atrial pressure of 3 mmHg, the estimated right ventricular systolic pressure is 123XX123 mmHg. Left Atrium: Left atrial size was severely dilated. Right Atrium: Right atrial size was moderately dilated. Pericardium: There is no evidence of pericardial effusion. Mitral Valve: The mitral valve is normal in structure. No evidence of mitral valve regurgitation. No evidence of  mitral valve stenosis. Tricuspid Valve: The tricuspid valve is normal in structure. Tricuspid valve regurgitation is moderate . No evidence of tricuspid stenosis. Aortic Valve: The aortic valve is tricuspid. Aortic valve regurgitation is not visualized. Aortic valve sclerosis/calcification is present, without any evidence of aortic stenosis. Pulmonic Valve: The pulmonic valve was normal in structure. Pulmonic valve regurgitation is trivial. No evidence of pulmonic stenosis. Aorta: The aortic root and ascending  aorta are structurally normal, with no evidence of dilitation. IAS/Shunts: No atrial level shunt detected by color flow Doppler.  LEFT VENTRICLE PLAX 2D LVIDd:         4.54 cm LVIDs:         2.62 cm LV PW:         1.37 cm LV IVS:        1.09 cm LVOT diam:     1.80 cm LV SV:         38 LV SV Index:   18 LVOT Area:     2.54 cm?  LV Volumes (MOD) LV vol d, MOD A2C: 53.7 ml LV vol d, MOD A4C: 84.0 ml LV vol s, MOD A2C: 24.2 ml LV vol s, MOD A4C: 21.8 ml LV SV MOD A2C:     29.5 ml LV SV MOD A4C:     84.0 ml LV SV MOD BP:      46.7 ml RIGHT VENTRICLE RV Basal diam:  4.40 cm RV Mid diam:    3.14 cm RV S prime:     7.40 cm/s TAPSE (M-mode): 0.8 cm LEFT ATRIUM             Index        RIGHT ATRIUM           Index LA diam:        4.90 cm 2.38 cm/m?   RA Area:     26.90 cm? LA Vol (A2C):   53.6 ml 25.99 ml/m?  RA Volume:   96.20 ml  46.64 ml/m? LA Vol (A4C):   99.0 ml 48.00 ml/m? LA Biplane Vol: 72.1 ml 34.96 ml/m?  AORTIC VALVE             PULMONIC VALVE LVOT Vmax:   88.80 cm/s  PV Vmax:       0.74 m/s LVOT Vmean:  61.200 cm/s PV Peak grad:  2.2 mmHg LVOT VTI:    0.148 m  AORTA Ao Root diam: 2.90 cm Ao Asc diam:  3.00 cm MV E velocity: 110.00 cm/s  TRICUSPID VALVE                             TR Peak grad:   47.6 mmHg                             TR Vmax:        345.00 cm/s                              SHUNTS                             Systemic VTI:  0.15 m                             Systemic Diam: 1.80 cm Rudean Haskell MD Electronically signed by Rudean Haskell MD Signature Date/Time: 08/18/2021/5:23:18 PM    Final    ? ?Cardiac Studies  ? ?ECHO: 08/18/2021 ? 1. Left ventricular ejection fraction, by estim

## 2021-08-20 NOTE — Plan of Care (Signed)
?  Problem: Education: ?Goal: Ability to demonstrate management of disease process will improve ?Outcome: Progressing ?  ?Problem: Activity: ?Goal: Capacity to carry out activities will improve ?Outcome: Progressing ?  ?Problem: Cardiac: ?Goal: Ability to achieve and maintain adequate cardiopulmonary perfusion will improve ?Outcome: Progressing ?  ?

## 2021-08-21 DIAGNOSIS — I50813 Acute on chronic right heart failure: Secondary | ICD-10-CM | POA: Diagnosis not present

## 2021-08-21 LAB — COMPREHENSIVE METABOLIC PANEL
ALT: 11 U/L (ref 0–44)
AST: 21 U/L (ref 15–41)
Albumin: 3.7 g/dL (ref 3.5–5.0)
Alkaline Phosphatase: 60 U/L (ref 38–126)
Anion gap: 8 (ref 5–15)
BUN: 22 mg/dL (ref 8–23)
CO2: 25 mmol/L (ref 22–32)
Calcium: 9.2 mg/dL (ref 8.9–10.3)
Chloride: 103 mmol/L (ref 98–111)
Creatinine, Ser: 1.32 mg/dL — ABNORMAL HIGH (ref 0.61–1.24)
GFR, Estimated: 56 mL/min — ABNORMAL LOW (ref >60)
Glucose, Bld: 150 mg/dL — ABNORMAL HIGH (ref 70–99)
Potassium: 3.5 mmol/L (ref 3.5–5.1)
Sodium: 136 mmol/L (ref 135–145)
Total Bilirubin: 1.3 mg/dL — ABNORMAL HIGH (ref 0.3–1.2)
Total Protein: 6.9 g/dL (ref 6.5–8.1)

## 2021-08-21 LAB — GLUCOSE, CAPILLARY
Glucose-Capillary: 146 mg/dL — ABNORMAL HIGH (ref 70–99)
Glucose-Capillary: 263 mg/dL — ABNORMAL HIGH (ref 70–99)
Glucose-Capillary: 83 mg/dL (ref 70–99)
Glucose-Capillary: 159 mg/dL — ABNORMAL HIGH (ref 70–99)

## 2021-08-21 MED ORDER — POTASSIUM CHLORIDE CRYS ER 20 MEQ PO TBCR
40.0000 meq | EXTENDED_RELEASE_TABLET | Freq: Once | ORAL | Status: AC
  Administered 2021-08-21: 40 meq via ORAL
  Filled 2021-08-21: qty 2

## 2021-08-21 MED ORDER — SODIUM CHLORIDE 0.9 % IV SOLN: INTRAVENOUS | Status: DC

## 2021-08-21 MED ORDER — HYDRALAZINE HCL 25 MG PO TABS
25.0000 mg | ORAL_TABLET | Freq: Three times a day (TID) | ORAL | Status: DC
Start: 2021-08-21 — End: 2021-08-22
  Administered 2021-08-21 – 2021-08-22 (×4): 25 mg via ORAL
  Filled 2021-08-21 (×4): qty 1

## 2021-08-21 NOTE — Progress Notes (Signed)
? ?Progress Note ? ?Patient Name: Leon Dennis ?Date of Encounter: 08/21/2021 ? ?Primary Cardiologist:   Janina Mayo, MD ? ? ?Subjective  ? ?Breathing OK. No pain.  No SOB.  ? ?Inpatient Medications  ?  ?Scheduled Meds: ? amLODipine  10 mg Oral Daily  ? apixaban  5 mg Oral BID  ? busPIRone  5 mg Oral TID  ? dapagliflozin propanediol  5 mg Oral Daily  ? furosemide  60 mg Intravenous BID  ? hydrALAZINE  25 mg Oral BID  ? insulin aspart  0-15 Units Subcutaneous TID WC  ? insulin aspart  0-5 Units Subcutaneous QHS  ? lisinopril  40 mg Oral Daily  ? metoprolol tartrate  100 mg Oral BID  ? potassium chloride  40 mEq Oral BID  ? ?Continuous Infusions: ? ?PRN Meds: ?acetaminophen, acetaminophen, magnesium hydroxide, melatonin, ondansetron (ZOFRAN) IV, sodium chloride, triamcinolone  ? ?Vital Signs  ?  ?Vitals:  ? 08/20/21 1657 08/20/21 2044 08/21/21 0300 08/21/21 0333  ?BP: (!) 154/84 (!) 150/93  (!) 147/97  ?Pulse: 93 93  87  ?Resp: 15 16  16   ?Temp: 97.6 ?F (36.4 ?C) 97.6 ?F (36.4 ?C) 97.7 ?F (36.5 ?C)   ?TempSrc: Oral Oral Oral   ?SpO2: 96% 98% 94%   ?Weight:   86 kg   ?Height:      ? ? ?Intake/Output Summary (Last 24 hours) at 08/21/2021 0757 ?Last data filed at 08/21/2021 0700 ?Gross per 24 hour  ?Intake 1273 ml  ?Output 2310 ml  ?Net -1037 ml  ? ?Filed Weights  ? 08/19/21 0700 08/20/21 0442 08/21/21 0300  ?Weight: 90.6 kg 88.5 kg 86 kg  ? ? ?Telemetry  ?  ?Atrial fib with controlled ventricular rate- Personally Reviewed ? ?ECG  ?  ?NA - Personally Reviewed ? ?Physical Exam  ? ?GEN: No  acute distress.   ?Neck: No  JVD ?Cardiac: Irregular RR, no murmurs, rubs, or gallops.  ?Respiratory: Clear clear to auscultation bilaterally. ?GI: Soft, nontender, non-distended, normal bowel sounds  ?MS: Moderate bilateral lower extremity edema; No deformity. ?Neuro:   Nonfocal  ?Psych: Oriented and appropriate  ? ? ?Labs  ?  ?Chemistry ?Recent Labs  ?Lab 08/19/21 ?0701 08/20/21 ?0324 08/21/21 ?0320  ?NA 137 137 136  ?K 2.9* 3.4*  3.5  ?CL 104 102 103  ?CO2 23 26 25   ?GLUCOSE 89 118* 150*  ?BUN 18 22 22   ?CREATININE 1.24 1.39* 1.32*  ?CALCIUM 8.9 9.1 9.2  ?PROT 6.7 6.8 6.9  ?ALBUMIN 3.5 3.7 3.7  ?AST 18 22 21   ?ALT 12 12 11   ?ALKPHOS 59 61 60  ?BILITOT 1.5* 1.7* 1.3*  ?GFRNONAA >60 53* 56*  ?ANIONGAP 10 9 8   ?  ? ?Hematology ?Recent Labs  ?Lab 08/18/21 ?1454  ?WBC 5.5  ?RBC 4.23  ?HGB 12.5*  ?HCT 37.3*  ?MCV 88.2  ?MCH 29.6  ?MCHC 33.5  ?RDW 14.9  ?PLT 183  ? ? ?Cardiac EnzymesNo results for input(s): TROPONINI in the last 168 hours. No results for input(s): TROPIPOC in the last 168 hours.  ? ?BNP ?Recent Labs  ?Lab 08/18/21 ?1454  ?BNP 1,394.0*  ?  ? ?DDimer  ?Recent Labs  ?Lab 08/18/21 ?1454  ?DDIMER 2.81*  ?  ? ?Radiology  ?  ?No results found. ? ?Cardiac Studies  ? ?ECHO: 08/18/2021 ? 1. Left ventricular ejection fraction, by estimation, is 55 to 60%. The  ?left ventricle has normal function. The left ventricle has no regional  ?wall motion abnormalities.  There is mild concentric left ventricular  ?hypertrophy. Left ventricular diastolic  ?parameters are indeterminate.  ? 2. Right ventricular systolic function is mildly to moderately reduced.  ?The right ventricular size is moderatly enlarged. RVSP Moderately  ?increased 51 mm Hg.  ? 3. Left atrial size was severely dilated.  ? 4. Right atrial size was moderately dilated.  ? 5. The mitral valve is normal in structure. No evidence of mitral valve  ?regurgitation. No evidence of mitral stenosis.  ? 6. Tricuspid valve regurgitation is moderate.  ? 7. The aortic valve is tricuspid. Aortic valve regurgitation is not  ?visualized. Aortic valve sclerosis/calcification is present, without any  ? ?Patient Profile  ?   ?76 y.o. male with history of right sided heart failure, paroxysmal atrial fibrillation, CKD stage IIIa, HTN, Type 2 DM, EF 45-50% w/ decreased RV function, pulm HTN, was admitted 04/20 with volume overload. ? ?Assessment & Plan  ?  ?HFpEF, w/ RV dysfunction and pulm HTN:   Net -5.6  L.  Renal function seems to be tolerating this.  Continue current IV diuresis.  He was scheduled for right heart catheterization as an outpatient.  He and I discussed this.  This has been cancelled.   Creat is tolerating current diuresis.    ? ?Hypokalemia: Took and extra 40 meq today and potassium is barely in the normal range.  I will give an extra 40 meq again today.   ?  ?Atrial fib, persistent: Rate controlled.  Tolerating Eliquis.  Plans for cardioversion Monday.  Keep NPO after MN.   ?  ?HTN: Continue current therapy.  If his blood pressure continues to be slightly elevated might increase hydralazine though 1 have enough preload with his RV dysfunction. ?  ? ?For questions or updates, please contact [facility] ?Please consult [email or web address] for contact info under Cardiology/STEMI. ?  ?Signed, ?Minus Breeding, MD  ?08/21/2021, 7:57 AM   ? ?

## 2021-08-21 NOTE — H&P (View-Only) (Signed)
? ?Progress Note ? ?Patient Name: Leon Dennis ?Date of Encounter: 08/21/2021 ? ?Primary Cardiologist:   Janina Mayo, MD ? ? ?Subjective  ? ?Breathing OK. No pain.  No SOB.  ? ?Inpatient Medications  ?  ?Scheduled Meds: ? amLODipine  10 mg Oral Daily  ? apixaban  5 mg Oral BID  ? busPIRone  5 mg Oral TID  ? dapagliflozin propanediol  5 mg Oral Daily  ? furosemide  60 mg Intravenous BID  ? hydrALAZINE  25 mg Oral BID  ? insulin aspart  0-15 Units Subcutaneous TID WC  ? insulin aspart  0-5 Units Subcutaneous QHS  ? lisinopril  40 mg Oral Daily  ? metoprolol tartrate  100 mg Oral BID  ? potassium chloride  40 mEq Oral BID  ? ?Continuous Infusions: ? ?PRN Meds: ?acetaminophen, acetaminophen, magnesium hydroxide, melatonin, ondansetron (ZOFRAN) IV, sodium chloride, triamcinolone  ? ?Vital Signs  ?  ?Vitals:  ? 08/20/21 1657 08/20/21 2044 08/21/21 0300 08/21/21 0333  ?BP: (!) 154/84 (!) 150/93  (!) 147/97  ?Pulse: 93 93  87  ?Resp: 15 16  16   ?Temp: 97.6 ?F (36.4 ?C) 97.6 ?F (36.4 ?C) 97.7 ?F (36.5 ?C)   ?TempSrc: Oral Oral Oral   ?SpO2: 96% 98% 94%   ?Weight:   86 kg   ?Height:      ? ? ?Intake/Output Summary (Last 24 hours) at 08/21/2021 0757 ?Last data filed at 08/21/2021 0700 ?Gross per 24 hour  ?Intake 1273 ml  ?Output 2310 ml  ?Net -1037 ml  ? ?Filed Weights  ? 08/19/21 0700 08/20/21 0442 08/21/21 0300  ?Weight: 90.6 kg 88.5 kg 86 kg  ? ? ?Telemetry  ?  ?Atrial fib with controlled ventricular rate- Personally Reviewed ? ?ECG  ?  ?NA - Personally Reviewed ? ?Physical Exam  ? ?GEN: No  acute distress.   ?Neck: No  JVD ?Cardiac: Irregular RR, no murmurs, rubs, or gallops.  ?Respiratory: Clear clear to auscultation bilaterally. ?GI: Soft, nontender, non-distended, normal bowel sounds  ?MS: Moderate bilateral lower extremity edema; No deformity. ?Neuro:   Nonfocal  ?Psych: Oriented and appropriate  ? ? ?Labs  ?  ?Chemistry ?Recent Labs  ?Lab 08/19/21 ?0701 08/20/21 ?0324 08/21/21 ?0320  ?NA 137 137 136  ?K 2.9* 3.4*  3.5  ?CL 104 102 103  ?CO2 23 26 25   ?GLUCOSE 89 118* 150*  ?BUN 18 22 22   ?CREATININE 1.24 1.39* 1.32*  ?CALCIUM 8.9 9.1 9.2  ?PROT 6.7 6.8 6.9  ?ALBUMIN 3.5 3.7 3.7  ?AST 18 22 21   ?ALT 12 12 11   ?ALKPHOS 59 61 60  ?BILITOT 1.5* 1.7* 1.3*  ?GFRNONAA >60 53* 56*  ?ANIONGAP 10 9 8   ?  ? ?Hematology ?Recent Labs  ?Lab 08/18/21 ?1454  ?WBC 5.5  ?RBC 4.23  ?HGB 12.5*  ?HCT 37.3*  ?MCV 88.2  ?MCH 29.6  ?MCHC 33.5  ?RDW 14.9  ?PLT 183  ? ? ?Cardiac EnzymesNo results for input(s): TROPONINI in the last 168 hours. No results for input(s): TROPIPOC in the last 168 hours.  ? ?BNP ?Recent Labs  ?Lab 08/18/21 ?1454  ?BNP 1,394.0*  ?  ? ?DDimer  ?Recent Labs  ?Lab 08/18/21 ?1454  ?DDIMER 2.81*  ?  ? ?Radiology  ?  ?No results found. ? ?Cardiac Studies  ? ?ECHO: 08/18/2021 ? 1. Left ventricular ejection fraction, by estimation, is 55 to 60%. The  ?left ventricle has normal function. The left ventricle has no regional  ?wall motion abnormalities.  There is mild concentric left ventricular  ?hypertrophy. Left ventricular diastolic  ?parameters are indeterminate.  ? 2. Right ventricular systolic function is mildly to moderately reduced.  ?The right ventricular size is moderatly enlarged. RVSP Moderately  ?increased 51 mm Hg.  ? 3. Left atrial size was severely dilated.  ? 4. Right atrial size was moderately dilated.  ? 5. The mitral valve is normal in structure. No evidence of mitral valve  ?regurgitation. No evidence of mitral stenosis.  ? 6. Tricuspid valve regurgitation is moderate.  ? 7. The aortic valve is tricuspid. Aortic valve regurgitation is not  ?visualized. Aortic valve sclerosis/calcification is present, without any  ? ?Patient Profile  ?   ?76 y.o. male with history of right sided heart failure, paroxysmal atrial fibrillation, CKD stage IIIa, HTN, Type 2 DM, EF 45-50% w/ decreased RV function, pulm HTN, was admitted 04/20 with volume overload. ? ?Assessment & Plan  ?  ?HFpEF, w/ RV dysfunction and pulm HTN:   Net -5.6  L.  Renal function seems to be tolerating this.  Continue current IV diuresis.  He was scheduled for right heart catheterization as an outpatient.  He and I discussed this.  This has been cancelled.   Creat is tolerating current diuresis.    ? ?Hypokalemia: Took and extra 40 meq today and potassium is barely in the normal range.  I will give an extra 40 meq again today.   ?  ?Atrial fib, persistent: Rate controlled.  Tolerating Eliquis.  Plans for cardioversion Monday.  Keep NPO after MN.   ?  ?HTN: Continue current therapy.  If his blood pressure continues to be slightly elevated might increase hydralazine though 1 have enough preload with his RV dysfunction. ?  ? ?For questions or updates, please contact [place] ?Please consult [email or web address] for contact info under Cardiology/STEMI. ?  ?Signed, ?Minus Breeding, MD  ?08/21/2021, 7:57 AM   ? ?

## 2021-08-21 NOTE — Progress Notes (Signed)
Mobility Specialist Progress Note: ? ? 08/21/21 1421  ?Mobility  ?Activity Ambulated independently in hallway  ?Level of Assistance Independent  ?Assistive Device None  ?Distance Ambulated (ft) 500 ft  ?Activity Response Tolerated well  ?$Mobility charge 1 Mobility  ? ?Pt walking in hall by themselves. No complaints of pain. Pt in room with all needs met.  ? ?Leon Dennis ?Mobility Specialist ?Primary Phone [phone] ? ?

## 2021-08-22 ENCOUNTER — Inpatient Hospital Stay (HOSPITAL_COMMUNITY): Payer: Medicare Other | Admitting: Certified Registered Nurse Anesthetist

## 2021-08-22 ENCOUNTER — Encounter (HOSPITAL_COMMUNITY)
Admission: AD | Disposition: A | Discharge status: Home / Self Care | Payer: Self-pay | Source: Ambulatory Visit | Attending: Cardiology

## 2021-08-22 ENCOUNTER — Encounter (HOSPITAL_COMMUNITY): Payer: Self-pay | Admitting: Cardiology

## 2021-08-22 ENCOUNTER — Inpatient Hospital Stay (HOSPITAL_COMMUNITY): Admission: RE | Admit: 2021-08-22 | Payer: Medicare Other | Source: Home / Self Care | Admitting: Internal Medicine

## 2021-08-22 DIAGNOSIS — I272 Pulmonary hypertension, unspecified: Secondary | ICD-10-CM | POA: Diagnosis not present

## 2021-08-22 DIAGNOSIS — I10 Essential (primary) hypertension: Secondary | ICD-10-CM

## 2021-08-22 DIAGNOSIS — I5031 Acute diastolic (congestive) heart failure: Secondary | ICD-10-CM | POA: Diagnosis not present

## 2021-08-22 DIAGNOSIS — I4891 Unspecified atrial fibrillation: Secondary | ICD-10-CM

## 2021-08-22 DIAGNOSIS — I480 Paroxysmal atrial fibrillation: Secondary | ICD-10-CM | POA: Diagnosis not present

## 2021-08-22 DIAGNOSIS — I50813 Acute on chronic right heart failure: Secondary | ICD-10-CM | POA: Diagnosis not present

## 2021-08-22 DIAGNOSIS — E119 Type 2 diabetes mellitus without complications: Secondary | ICD-10-CM

## 2021-08-22 HISTORY — PX: CARDIOVERSION: SHX1299

## 2021-08-22 LAB — BASIC METABOLIC PANEL
Anion gap: 11 (ref 5–15)
BUN: 21 mg/dL (ref 8–23)
CO2: 28 mmol/L (ref 22–32)
Calcium: 9.5 mg/dL (ref 8.9–10.3)
Chloride: 101 mmol/L (ref 98–111)
Creatinine, Ser: 1.39 mg/dL — ABNORMAL HIGH (ref 0.61–1.24)
GFR, Estimated: 53 mL/min — ABNORMAL LOW (ref >60)
Glucose, Bld: 140 mg/dL — ABNORMAL HIGH (ref 70–99)
Potassium: 3.2 mmol/L — ABNORMAL LOW (ref 3.5–5.1)
Sodium: 140 mmol/L (ref 135–145)

## 2021-08-22 LAB — CBC
HCT: 43.8 % (ref 39.0–52.0)
Hemoglobin: 14.4 g/dL (ref 13.0–17.0)
MCH: 29.4 pg (ref 26.0–34.0)
MCHC: 32.9 g/dL (ref 30.0–36.0)
MCV: 89.4 fL (ref 80.0–100.0)
Platelets: 204 10*3/uL (ref 150–400)
RBC: 4.9 MIL/uL (ref 4.22–5.81)
RDW: 15.2 % (ref 11.5–15.5)
WBC: 4.9 10*3/uL (ref 4.0–10.5)
nRBC: 0 % (ref 0.0–0.2)

## 2021-08-22 LAB — GLUCOSE, CAPILLARY
Glucose-Capillary: 137 mg/dL — ABNORMAL HIGH (ref 70–99)
Glucose-Capillary: 239 mg/dL — ABNORMAL HIGH (ref 70–99)

## 2021-08-22 SURGERY — CARDIOVERSION
Anesthesia: General

## 2021-08-22 SURGERY — RIGHT HEART CATH

## 2021-08-22 MED ORDER — OXYCODONE HCL 5 MG/5ML PO SOLN: 5.0000 mg | Freq: Once | ORAL | Status: DC | PRN

## 2021-08-22 MED ORDER — PROMETHAZINE HCL 25 MG/ML IJ SOLN: 6.2500 mg | INTRAMUSCULAR | Status: DC | PRN

## 2021-08-22 MED ORDER — PROPOFOL 10 MG/ML IV BOLUS
INTRAVENOUS | Status: DC | PRN
  Administered 2021-08-22: 100 mg via INTRAVENOUS

## 2021-08-22 MED ORDER — LIDOCAINE 2% (20 MG/ML) 5 ML SYRINGE
INTRAMUSCULAR | Status: DC | PRN
  Administered 2021-08-22: 100 mg via INTRAVENOUS

## 2021-08-22 MED ORDER — HYDROMORPHONE HCL 1 MG/ML IJ SOLN: 0.2500 mg | INTRAMUSCULAR | Status: DC | PRN

## 2021-08-22 MED ORDER — TORSEMIDE 20 MG PO TABS
20.0000 mg | ORAL_TABLET | Freq: Every day | ORAL | Status: DC
  Administered 2021-08-22: 20 mg via ORAL
  Filled 2021-08-22: qty 1

## 2021-08-22 MED ORDER — SODIUM CHLORIDE 0.9 % IV SOLN: INTRAVENOUS | Status: DC | PRN

## 2021-08-22 MED ORDER — APIXABAN 5 MG PO TABS
5.0000 mg | ORAL_TABLET | Freq: Once | ORAL | Status: AC
  Administered 2021-08-22: 5 mg via ORAL
  Filled 2021-08-22: qty 1

## 2021-08-22 MED ORDER — POTASSIUM CHLORIDE CRYS ER 20 MEQ PO TBCR
40.0000 meq | EXTENDED_RELEASE_TABLET | Freq: Once | ORAL | Status: AC
  Administered 2021-08-22: 40 meq via ORAL
  Filled 2021-08-22: qty 2

## 2021-08-22 MED ORDER — POTASSIUM CHLORIDE CRYS ER 20 MEQ PO TBCR: 40.0000 meq | EXTENDED_RELEASE_TABLET | Freq: Every day | ORAL | 6 refills | Status: DC

## 2021-08-22 MED ORDER — TORSEMIDE 20 MG PO TABS: 20.0000 mg | ORAL_TABLET | Freq: Every day | ORAL | 6 refills | Status: DC

## 2021-08-22 MED ORDER — HYDRALAZINE HCL 25 MG PO TABS: 25.0000 mg | ORAL_TABLET | Freq: Three times a day (TID) | ORAL | 6 refills | Status: DC

## 2021-08-22 MED ORDER — OXYCODONE HCL 5 MG PO TABS: 5.0000 mg | ORAL_TABLET | Freq: Once | ORAL | Status: DC | PRN

## 2021-08-22 MED ORDER — MEPERIDINE HCL 25 MG/ML IJ SOLN: 6.2500 mg | INTRAMUSCULAR | Status: DC | PRN

## 2021-08-22 NOTE — Interval H&P Note (Signed)
History and Physical Interval Note: ? ?08/22/2021 ?9:05 AM ? ?[name]  has presented today for surgery, with the diagnosis of AFIB.  The various methods of treatment have been discussed with the patient and family. After consideration of risks, benefits and other options for treatment, the patient has consented to  Procedure(s): ?CARDIOVERSION (N/A) as a surgical intervention.  The patient's history has been reviewed, patient examined, no change in status, stable for surgery.  I have reviewed the patient's chart and labs.  Questions were answered to the patient's satisfaction.   ? ? ?Leon Dennis ? ? ?

## 2021-08-22 NOTE — Transfer of Care (Signed)
Immediate Anesthesia Transfer of Care Note ? ?Patient: Leon Dennis ? ?Procedure(s) Performed: CARDIOVERSION ? ?Patient Location: PACU ? ?Anesthesia Type:General ? ?Level of Consciousness: drowsy ? ?Airway & Oxygen Therapy: Patient Spontanous Breathing ? ?Post-op Assessment: Report given to RN and Post -op Vital signs reviewed and stable ? ?Post vital signs: Reviewed and stable ? ?Last Vitals:  ?Vitals Value Taken Time  ?BP 117/76 08/22/21 09:32  ?Temp    ?Pulse 83 08/22/21 09:32  ?Resp 14 08/22/21 09:32  ?SpO2 94 08/22/21 09:32  ? ? ?Last Pain:  ?Vitals:  ? 08/22/21 0856  ?TempSrc:   ?PainSc: 0-No pain  ?   ? ?Patients Stated Pain Goal: 0 (08/19/21 0610) ? ?Complications: No notable events documented. ?

## 2021-08-22 NOTE — Anesthesia Preprocedure Evaluation (Signed)
Anesthesia Evaluation  ?Patient identified by MRN, date of birth, ID band ?Patient awake ? ? ? ?Reviewed: ?Allergy & Precautions, NPO status , Patient's Chart, lab work & pertinent test results ? ?Airway ?Mallampati: II ? ?TM Distance: >3 FB ?Neck ROM: Full ? ? ? Dental ?no notable dental hx. ? ?  ?Pulmonary ?neg pulmonary ROS,  ?  ?Pulmonary exam normal ?breath sounds clear to auscultation ? ? ? ? ? ? Cardiovascular ?hypertension, Normal cardiovascular exam+ dysrhythmias Atrial Fibrillation  ?Rhythm:Regular Rate:Normal ? ? ?  ?Neuro/Psych ?negative neurological ROS ? negative psych ROS  ? GI/Hepatic ?negative GI ROS, Neg liver ROS,   ?Endo/Other  ?negative endocrine ROSdiabetes, Type 2 ? Renal/GU ?negative Renal ROS  ?negative genitourinary ?  ?Musculoskeletal ?negative musculoskeletal ROS ?(+)  ? Abdominal ?  ?Peds ?negative pediatric ROS ?(+)  Hematology ?negative hematology ROS ?(+)   ?Anesthesia Other Findings ? ? Reproductive/Obstetrics ?negative OB ROS ? ?  ? ? ? ? ? ? ? ? ? ? ? ? ? ?  ?  ? ? ? ? ? ? ? ? ?Anesthesia Physical ?Anesthesia Plan ? ?ASA: 3 ? ?Anesthesia Plan: General  ? ?Post-op Pain Management: Minimal or no pain anticipated  ? ?Induction: Intravenous ? ?PONV Risk Score and Plan: 2 and Ondansetron, Midazolam and Treatment may vary due to age or medical condition ? ?Airway Management Planned: Mask ? ?Additional Equipment:  ? ?Intra-op Plan:  ? ?Post-operative Plan:  ? ?Informed Consent: I have reviewed the patients History and Physical, chart, labs and discussed the procedure including the risks, benefits and alternatives for the proposed anesthesia with the patient or authorized representative who has indicated his/her understanding and acceptance.  ? ? ? ?Dental advisory given ? ?Plan Discussed with: CRNA ? ?Anesthesia Plan Comments:   ? ? ? ? ? ? ?Anesthesia Quick Evaluation ? ?

## 2021-08-22 NOTE — Procedures (Signed)
Procedure: Electrical Cardioversion ?Indications:  Atrial Fibrillation ? ?Procedure Details: ? ?Consent: Risks of procedure as well as the alternatives and risks of each were explained to the (patient/caregiver).  Consent for procedure obtained. ? ?Time Out: Verified patient identification, verified procedure, site/side was marked, verified correct patient position, special equipment/implants available, medications/allergies/relevent history reviewed, required imaging and test results available. PERFORMED. ? ?Patient placed on cardiac monitor, pulse oximetry, supplemental oxygen as necessary.  ?Sedation given:  propofol 100mg ; lidocaine 100mg  ?Pacer pads placed anterior and posterior chest. ? ?Cardioverted 3 time(s).  ?Cardioversion with synchronized biphasic 150J, 200J, 200J shock. ? ?Evaluation: ?Findings: Post procedure EKG shows: Atrial Fibrillation ?Complications: None ?Patient did tolerate procedure well. ? ?Time Spent Directly with the Patient: ? ?  ? ? ?08/22/2021, 9:26 AM  ?

## 2021-08-22 NOTE — TOC Transition Note (Addendum)
Transition of Care (TOC) - CM/SW Discharge Note ? ? ?Patient Details  ?Name: Leon Dennis ?MRN: [id number] ?Date of Birth: [birth year] ? ?Transition of Care (TOC) CM/SW Contact:  ?Leone Haven, RN ?Phone Number: ?08/22/2021, 1:16 PM ? ? ?Clinical Narrative:    ?Patient for dc today, he has no needs. Wife will transport him home she is at the bedside. He was on eliquis pta.  ? ? ?  ?  ? ? ?Patient Goals and CMS Choice ?  ?  ?  ? ?Discharge Placement ?  ?           ?  ?  ?  ?  ? ?Discharge Plan and Services ?  ?  ?           ?  ?  ?  ?  ?  ?  ?  ?  ?  ?  ? ?Social Determinants of Health (SDOH) Interventions ?  ? ? ?Readmission Risk Interventions ?   ? View : No data to display.  ?  ?  ?  ? ? ? ? ? ?

## 2021-08-22 NOTE — Care Management Important Message (Signed)
Important Message ? ?Patient Details  ?Name: Leon Dennis ?MRN: [id number] ?Date of Birth: [birth year] ? ? ?Medicare Important Message Given:  Yes ? ? ? ? ?Renie Ora ?08/22/2021, 8:44 AM ?

## 2021-08-22 NOTE — Anesthesia Procedure Notes (Signed)
Procedure Name: General with mask airway ?Date/Time: 08/22/2021 9:21 AM ?Performed by: Tressia Miners, CRNA ?Pre-anesthesia Checklist: Patient identified, Emergency Drugs available, Suction available, Patient being monitored and Timeout performed ?Patient Re-evaluated:Patient Re-evaluated prior to induction ?Oxygen Delivery Method: Ambu bag ?Preoxygenation: Pre-oxygenation with 100% oxygen ?Induction Type: IV induction ?Ventilation: Mask ventilation without difficulty ? ? ? ? ?

## 2021-08-22 NOTE — Progress Notes (Signed)
? ?Progress Note ? ?Patient Name: Leon Dennis ?Date of Encounter: 08/22/2021 ? ?[facility] Cardiologist: Janina Mayo, MD  ? ?Subjective  ? ?Frustrated wants to go home.  HR is controlled.  ? ?Inpatient Medications  ?  ?Scheduled Meds: ? amLODipine  10 mg Oral Daily  ? apixaban  5 mg Oral BID  ? busPIRone  5 mg Oral TID  ? dapagliflozin propanediol  5 mg Oral Daily  ? furosemide  60 mg Intravenous BID  ? hydrALAZINE  25 mg Oral Q8H  ? insulin aspart  0-15 Units Subcutaneous TID WC  ? insulin aspart  0-5 Units Subcutaneous QHS  ? lisinopril  40 mg Oral Daily  ? metoprolol tartrate  100 mg Oral BID  ? potassium chloride  40 mEq Oral BID  ? ?Continuous Infusions: ? sodium chloride 20 mL/hr at 08/22/21 [id number]  ? ?PRN Meds: ?acetaminophen, acetaminophen, magnesium hydroxide, melatonin, ondansetron (ZOFRAN) IV, sodium chloride, triamcinolone  ? ?Vital Signs  ?  ?Vitals:  ? 08/21/21 0912 08/21/21 1332 08/21/21 1957 08/22/21 0323  ?BP: (!) 160/94 (!) 142/82 (!) 146/91 (!) 141/83  ?Pulse: 92  93 81  ?Resp:   16 16  ?Temp: 97.7 ?F (36.5 ?C) 97.6 ?F (36.4 ?C) 97.7 ?F (36.5 ?C) 97.6 ?F (36.4 ?C)  ?TempSrc: Oral Oral Oral Oral  ?SpO2: 95%  96% 93%  ?Weight:    84.4 kg  ?Height:      ? ? ?Intake/Output Summary (Last 24 hours) at 08/22/2021 0811 ?Last data filed at 08/22/2021 0326 ?Gross per 24 hour  ?Intake 720 ml  ?Output 2000 ml  ?Net -1280 ml  ? ? ?  08/22/2021  ?  3:23 AM 08/21/2021  ?  3:00 AM 08/20/2021  ?  4:42 AM  ?Last 3 Weights  ?Weight (lbs) 186 lb 1.6 oz 189 lb 11.2 oz 195 lb 3.2 oz  ?Weight (kg) 84.414 kg 86.047 kg 88.542 kg  ?   ? ?Telemetry  ?  ? A fib rate controlled <100 - Personally Reviewed ? ?ECG  ?  ?A fib with no acute ST changes - Personally Reviewed ? ?Physical Exam  ? ?GEN: No acute distress.   ?Neck: No JVD ?Cardiac: irreg irreg rate controlled, no murmurs, rubs, or gallops.  ?Respiratory: Clear to auscultation bilaterally. ?GI: Soft, nontender, non-distended  ?MS: 1+ lower ext to mid calf edema; No  deformity. Feet with redness  ?Neuro:  Nonfocal  ?Psych: Normal affect  ? ?Labs  ?  ?High Sensitivity Troponin:  No results for input(s): TROPONINIHS in the last 720 hours.   ?Chemistry ?Recent Labs  ?Lab 08/18/21 ?1454 08/19/21 ?0701 08/20/21 ?0324 08/21/21 ?0320  ?NA 136 137 137 136  ?K 3.1* 2.9* 3.4* 3.5  ?CL 104 104 102 103  ?CO2 24 23 26 25   ?GLUCOSE 272* 89 118* 150*  ?BUN 21 18 22 22   ?CREATININE 1.52* 1.24 1.39* 1.32*  ?CALCIUM 8.7* 8.9 9.1 9.2  ?MG 2.0  --   --   --   ?PROT 6.5 6.7 6.8 6.9  ?ALBUMIN 3.5 3.5 3.7 3.7  ?AST 21 18 22 21   ?ALT 13 12 12 11   ?ALKPHOS 58 59 61 60  ?BILITOT 1.2 1.5* 1.7* 1.3*  ?GFRNONAA 47* >60 53* 56*  ?ANIONGAP 8 10 9 8   ?  ?Lipids  ?Recent Labs  ?Lab 08/19/21 ?0701  ?CHOL 109  ?TRIG 50  ?HDL 47  ?[place] 52  ?CHOLHDL 2.3  ?  ?Hematology ?Recent Labs  ?Lab 08/18/21 ?1454  ?  WBC 5.5  ?RBC 4.23  ?HGB 12.5*  ?HCT 37.3*  ?MCV 88.2  ?MCH 29.6  ?MCHC 33.5  ?RDW 14.9  ?PLT 183  ? ?Thyroid  ?Recent Labs  ?Lab 08/18/21 ?1454  ?TSH 2.788  ?  ?BNP ?Recent Labs  ?Lab 08/18/21 ?1454  ?BNP 1,394.0*  ?  ?DDimer  ?Recent Labs  ?Lab 08/18/21 ?1454  ?DDIMER 2.81*  ?  ? ?Radiology  ?  ?No results found. ? ?Cardiac Studies  ? ?ECHO: 08/18/2021 ? 1. Left ventricular ejection fraction, by estimation, is 55 to 60%. The  ?left ventricle has normal function. The left ventricle has no regional  ?wall motion abnormalities. There is mild concentric left ventricular  ?hypertrophy. Left ventricular diastolic  ?parameters are indeterminate.  ? 2. Right ventricular systolic function is mildly to moderately reduced.  ?The right ventricular size is moderatly enlarged. RVSP Moderately  ?increased 51 mm Hg.  ? 3. Left atrial size was severely dilated.  ? 4. Right atrial size was moderately dilated.  ? 5. The mitral valve is normal in structure. No evidence of mitral valve  ?regurgitation. No evidence of mitral stenosis.  ? 6. Tricuspid valve regurgitation is moderate.  ? 7. The aortic valve is tricuspid. Aortic valve  regurgitation is not  ?visualized. Aortic valve sclerosis/calcification is present, without any  ? ?Patient Profile  ?   ?76 y.o. male  history of right sided heart failure, paroxysmal atrial fibrillation, CKD stage IIIa, HTN, Type 2 DM, EF 45-50% w/ decreased RV function, pulm HTN, was admitted 04/20 with volume overload and atrial fib.  ? ?Assessment & Plan  ?  ?HFpEF, w/ RV dysfunction and pulm HTN:   Net -6.7 L.  Renal function seems to be tolerating this.  lasix 60 mg BID  IV diuresis.  He was scheduled for right heart catheterization as an outpatient.  He and Dr. Percival Spanish discussed this.  This has been cancelled.   Creat is tolerating current diuresis.   wt down from 92.7 Kg to 84.4 Kg ?--on farxiga, zestril 40, hydralazine 25 mg every 8 hours amlodipine 10  ?--Cr yesterday 1.32 (pk of 1.52) ?(Home lasix was 40 mg daily) ?  ?Hypokalemia: Took and extra 40 meq today and potassium is barely in the normal range.  I will give an extra 40 meq again today.   ?  ?Atrial fib, persistent: Rate controlled.  Tolerating Eliquis.  cardioversion unsuccessful after 3 shocks.   ?--on lopressor 100 mg BID  ? Change amlodipine to dilt with lopressor vs antiarrythmic  defer to MD  pt really wants to go home his rate is controlled  ?  ?HTN: Continue current therapy.  If his blood pressure continues to be slightly elevated might increase hydralazine though 1 have enough preload with his RV dysfunction.  BP today 181/101  he had not had po meds until  now.  ?  ?  ? ?For questions or updates, please contact [place] ?Please consult [email or web address] for contact info under  ? ?  ?   ?Signed, ?Cecilie Kicks, NP  ?08/22/2021, 8:11 AM   ? ?

## 2021-08-22 NOTE — Discharge Instructions (Addendum)
Weigh daily and call the office if you gain 3 pounds in day or 5 lbs in a week.  ? ?Take an extra Kdur 40 meq tonight for low k+ and then begin 40 meq once daily tomorrow ? ?We changed your lasix to torsemide, it may work better as diuretic for you.  If weight goes up call the office please. We will direct fluid medicine at that point.   Put lasix away and do not use while on torsemide.  ? ?Low salt diabetic diet.   ? ?Please see your renal physician as instructed.  ? ?Call if any questions or concerns. ? ?Will have you seen in [facility] for follow up -  ask Dr Harl Bowie about switching to [facility] or [place]   they just don't have any appts in near future.   ?

## 2021-08-22 NOTE — Discharge Summary (Addendum)
?Discharge Summary  ?  ?Patient ID: Johnna Acosta ?MRN: [id number]; DOB: September 30, 1945 ? ?Admit date: 08/18/2021 ?Discharge date: 08/22/2021 ? ?PCP:  Pcp, No ?  ?[facility] Providers ?Cardiologist:  Janina Mayo, MD      ? ? ?Discharge Diagnoses  ?  ?Principal Problem: ?  Right-sided heart failure ([place]) ?Active Problems: ?  Paroxysmal atrial fibrillation ([place]) ?  Hypertension ?  Pulmonary hypertension, unspecified ([place]) ?  PAH (pulmonary artery hypertension) ([place]) ?  Acute diastolic HF (heart failure) ([place]) ? ? ? ?Diagnostic Studies/Procedures  ?  ?Echo 08/18/21 ?IMPRESSIONS  ? ? ? 1. Left ventricular ejection fraction, by estimation, is 55 to 60%. The  ?left ventricle has normal function. The left ventricle has no regional  ?wall motion abnormalities. There is mild concentric left ventricular  ?hypertrophy. Left ventricular diastolic  ?parameters are indeterminate.  ? 2. Right ventricular systolic function is mildly to moderately reduced.  ?The right ventricular size is moderatly enlarged. RVSP Moderately  ?increased 51 mm Hg.  ? 3. Left atrial size was severely dilated.  ? 4. Right atrial size was moderately dilated.  ? 5. The mitral valve is normal in structure. No evidence of mitral valve  ?regurgitation. No evidence of mitral stenosis.  ? 6. Tricuspid valve regurgitation is moderate.  ? 7. The aortic valve is tricuspid. Aortic valve regurgitation is not  ?visualized. Aortic valve sclerosis/calcification is present, without any  ?evidence of aortic stenosis.  ? ?Comparison(s): No prior Echocardiogram.  ? ?FINDINGS  ? Left Ventricle: Left ventricular ejection fraction, by estimation, is 55  ?to 60%. The left ventricle has normal function. The left ventricle has no  ?regional wall motion abnormalities. The left ventricular internal cavity  ?size was normal in size. There is  ? mild concentric left ventricular hypertrophy. Left ventricular diastolic  ?parameters are indeterminate.  ? ?Right Ventricle: The right  ventricular size is moderately enlarged. No  ?increase in right ventricular wall thickness. Right ventricular systolic  ?function is mildly reduced. There is moderately elevated pulmonary artery  ?systolic pressure. The tricuspid  ?regurgitant velocity is 3.45 m/s, and with an assumed right atrial  ?pressure of 3 mmHg, the estimated right ventricular systolic pressure is  ?123XX123 mmHg.  ? ?Left Atrium: Left atrial size was severely dilated.  ? ?Right Atrium: Right atrial size was moderately dilated.  ? ?Pericardium: There is no evidence of pericardial effusion.  ? ?Mitral Valve: The mitral valve is normal in structure. No evidence of  ?mitral valve regurgitation. No evidence of mitral valve stenosis.  ? ?Tricuspid Valve: The tricuspid valve is normal in structure. Tricuspid  ?valve regurgitation is moderate . No evidence of tricuspid stenosis.  ? ?Aortic Valve: The aortic valve is tricuspid. Aortic valve regurgitation is  ?not visualized. Aortic valve sclerosis/calcification is present, without  ?any evidence of aortic stenosis.  ? ?Pulmonic Valve: The pulmonic valve was normal in structure. Pulmonic valve  ?regurgitation is trivial. No evidence of pulmonic stenosis.  ? ?Aorta: The aortic root and ascending aorta are structurally normal, with  ?no evidence of dilitation.  ? ?IAS/Shunts: No atrial level shunt detected by color flow Doppler.  ? ?DCCV 08/22/21 ?Cardioverted 3 time(s).  ?Cardioversion with synchronized biphasic 150J, 200J, 200J shock. ?  ?Evaluation: ?Findings: Post procedure EKG shows: Atrial Fibrillation ?Complications: None ?Patient did tolerate procedure well. ? ?_____________ ?  ?History of Present Illness   ?  ?Markeese Jochem is a 76 y.o. male with incidentally noted to be in atrial fibrillation at a  visit with his PCP on 06/28/21. He denied any symptoms of afib at that time. Patient was started on eliquis and referred to [facility] for cardiology evaluation. He was seen on 07/13/21, had an  echocardiogram that showed EF 45-50%, a moderately enlarged right ventricle, mildly decreased RV systolic function, pulmonary HTN, moderately dilated left atrium, severely dilated right atrium, mild MRV, moderately elevated pulmonary artery systolic pressure. Patient was referred to [facility] for further management. Prior to follow up, patient was seen in the ED on 4/11 and by his PCP on 4/13. Both times, patient complained of LE edema, weight gain, DOE, abdominal distension. Patient was started on lasix.  on follow up visit with Dr. Beckie Busing he was experiencing SOB, abdominal edema, BLE edema. Patient was concerned about increasing his lasix dose due to frequent urination.  Thought that patient could benefit from IV lasix, so he was directly admitted to [facility] on 08/18/21. ? ?On arrival at [facility] he was seen and evaluated by Dr. Marlou Porch and also noted to being evaluated for sleep apnea as outpt. Pt was volume overloaded and IV lasix started.  Thought would be for RHC at some point.  His eliquis was continued and plans for DCCV since pt had been on eliquis for >1 month. His a fib was rate controlled and lopressor continued.  BP was stable. He was placed on SSI  during stay for his DM-2. ? ? ?Hospital Course  ?   ?Consultants: none  ? ?Here pt diuresed on IV lasix 6.7 L on lasix 60 mg BID.  Wt down from 92.7 kg to 84.4 Kg.  He did well during stay and on 08/22/21 underwent DCCV  X 3 shocks but was unsuccessful.   ? ?Dr. Harrell Gave discussed with pt and family choices of antiarrythmic but that would prolong hospital stay.  She did not wish to begin sotalol at this point with recovering from acute CHF.  He still has mild edema.  Will add torsemide instead of lasix to control edema.     Will have pt follow up closely for management and possible medication changes.  Pt and family agreeable to this approach.  At some point they would like care to be changed to [place] or [place] (they live in [place]  but for this follow up will need to be in [place].  ? ?K+ was 3.2 at discharge and will have total of 120 meq today and begin 40 meq daily on the 25th.   ? ?RHC was considered this admit but pt would prefer to hold off.  Will discuss as outpt.   ? ?Pt will weigh daily and call if wt increases - if no increase of urine outpt would increase to torsemide 40 mg daily and if wt gain but is voiding would change to 20 mg BID.   ? ?Pt to nephrologist on Sat.   ? ?Dr. Harrell Gave has seen, evaluated and discussed care with pt and wife.   ? ?Did the patient have an acute coronary syndrome (MI, NSTEMI, STEMI, etc) this admission?:  No                               ?Did the patient have a percutaneous coronary intervention (stent / angioplasty)?:  No.   ? ?   ? ?The patient will be scheduled for a TOC follow up appointment 08/29/21.  A message has been sent to the [facility]  and Scheduling Pool at the office where the patient should be seen for follow up.  ?_____________ ? ?Discharge Vitals ?Blood pressure 133/77, pulse 76, temperature 97.6 ?F (36.4 ?C), temperature source Oral, resp. rate 12, height 5\' 8"  (1.727 m), weight 84.4 kg, SpO2 94 %.  ?Filed Weights  ? 08/20/21 0442 08/21/21 0300 08/22/21 0323  ?Weight: 88.5 kg 86 kg 84.4 kg  ? ? ?Labs & Radiologic Studies  ?  ?CBC ?Recent Labs  ?  08/22/21 ?1107  ?WBC 4.9  ?HGB 14.4  ?HCT 43.8  ?MCV 89.4  ?PLT 204  ? ?Basic Metabolic Panel ?Recent Labs  ?  08/21/21 ?0320 08/22/21 ?1107  ?NA 136 140  ?K 3.5 3.2*  ?CL 103 101  ?CO2 25 28  ?GLUCOSE 150* 140*  ?BUN 22 21  ?CREATININE 1.32* 1.39*  ?CALCIUM 9.2 9.5  ? ?Liver Function Tests ?Recent Labs  ?  08/20/21 ?0324 08/21/21 ?0320  ?AST 22 21  ?ALT 12 11  ?ALKPHOS 61 60  ?BILITOT 1.7* 1.3*  ?PROT 6.8 6.9  ?ALBUMIN 3.7 3.7  ? ?No results for input(s): LIPASE, AMYLASE in the last 72 hours. ?High Sensitivity Troponin:   ?No results for input(s): TROPONINIHS in the last 720 hours.  ?BNP ?Invalid input(s): POCBNP ?D-Dimer ?No results for  input(s): DDIMER in the last 72 hours. ?Hemoglobin A1C ?No results for input(s): HGBA1C in the last 72 hours. ?Fasting Lipid Panel ?No results for input(s): CHOL, HDL, LDLCALC, TRIG, CHOLHDL, LDLDIRECT in the l

## 2021-08-22 NOTE — Anesthesia Postprocedure Evaluation (Signed)
Anesthesia Post Note ? ?Patient: Leon Dennis ? ?Procedure(s) Performed: CARDIOVERSION ? ?  ? ?Patient location during evaluation: PACU ?Anesthesia Type: General ?Level of consciousness: awake and alert ?Pain management: pain level controlled ?Vital Signs Assessment: post-procedure vital signs reviewed and stable ?Respiratory status: spontaneous breathing, nonlabored ventilation and respiratory function stable ?Cardiovascular status: blood pressure returned to baseline and stable ?Postop Assessment: no apparent nausea or vomiting ?Anesthetic complications: no ? ? ?No notable events documented. ? ?Last Vitals:  ?Vitals:  ? 08/22/21 1000 08/22/21 1200  ?BP: (!) 162/105 133/77  ?Pulse:    ?Resp:    ?Temp:  36.4 ?C  ?SpO2:    ?  ?Last Pain:  ?Vitals:  ? 08/22/21 1200  ?TempSrc: Oral  ?PainSc:   ? ? ?  ?  ?  ?  ?  ?  ? ?Lowella Curb ? ? ? ? ?

## 2021-08-24 ENCOUNTER — Encounter (HOSPITAL_COMMUNITY): Payer: Self-pay | Admitting: Cardiology

## 2021-08-29 ENCOUNTER — Ambulatory Visit (INDEPENDENT_AMBULATORY_CARE_PROVIDER_SITE_OTHER): Payer: Medicare Other | Admitting: Internal Medicine

## 2021-08-29 ENCOUNTER — Other Ambulatory Visit (HOSPITAL_COMMUNITY): Payer: Self-pay | Admitting: Nephrology

## 2021-08-29 ENCOUNTER — Other Ambulatory Visit: Payer: Self-pay | Admitting: Nephrology

## 2021-08-29 ENCOUNTER — Encounter: Payer: Self-pay | Admitting: Internal Medicine

## 2021-08-29 VITALS — BP 100/63 | HR 77 | Ht 68.0 in | Wt 192.0 lb

## 2021-08-29 DIAGNOSIS — I4891 Unspecified atrial fibrillation: Secondary | ICD-10-CM

## 2021-08-29 DIAGNOSIS — E1122 Type 2 diabetes mellitus with diabetic chronic kidney disease: Secondary | ICD-10-CM

## 2021-08-29 DIAGNOSIS — E1129 Type 2 diabetes mellitus with other diabetic kidney complication: Secondary | ICD-10-CM

## 2021-08-29 DIAGNOSIS — I50813 Acute on chronic right heart failure: Secondary | ICD-10-CM

## 2021-08-29 DIAGNOSIS — D638 Anemia in other chronic diseases classified elsewhere: Secondary | ICD-10-CM

## 2021-08-29 DIAGNOSIS — R0602 Shortness of breath: Secondary | ICD-10-CM

## 2021-08-29 DIAGNOSIS — R809 Proteinuria, unspecified: Secondary | ICD-10-CM

## 2021-08-29 LAB — COMPREHENSIVE METABOLIC PANEL
ALT: 13 IU/L (ref 0–44)
AST: 23 IU/L (ref 0–40)
Albumin/Globulin Ratio: 1.6 (ref 1.2–2.2)
Albumin: 4.3 g/dL (ref 3.7–4.7)
Alkaline Phosphatase: 82 IU/L (ref 44–121)
BUN/Creatinine Ratio: 14 (ref 10–24)
BUN: 22 mg/dL (ref 8–27)
Bilirubin Total: 0.6 mg/dL (ref 0.0–1.2)
CO2: 24 mmol/L (ref 20–29)
Calcium: 9.4 mg/dL (ref 8.6–10.2)
Chloride: 100 mmol/L (ref 96–106)
Creatinine, Ser: 1.54 mg/dL — ABNORMAL HIGH (ref 0.76–1.27)
Globulin, Total: 2.7 g/dL (ref 1.5–4.5)
Glucose: 169 mg/dL — ABNORMAL HIGH (ref 70–99)
Potassium: 4 mmol/L (ref 3.5–5.2)
Sodium: 137 mmol/L (ref 134–144)
Total Protein: 7 g/dL (ref 6.0–8.5)
eGFR: 46 mL/min/{1.73_m2} — ABNORMAL LOW (ref >59)

## 2021-08-29 NOTE — Patient Instructions (Signed)
Medication Instructions:  ?Continue same medications ?*If you need a refill on your cardiac medications before your next appointment, please call your pharmacy* ? ? ?Lab Work: ?Cmet today ? ? ?Testing/Procedures: ?PFT's ? ? ?Follow-Up: ?At [facility], you and your health needs are our priority.  As part of our continuing mission to provide you with exceptional heart care, we have created designated Provider Care Teams.  These Care Teams include your primary Cardiologist (physician) and Advanced Practice Providers (APPs -  Physician Assistants and Nurse Practitioners) who all work together to provide you with the care you need, when you need it. ? ?We recommend signing up for the patient portal called "MyChart".  Sign up information is provided on this After Visit Summary.  MyChart is used to connect with patients for Virtual Visits (Telemedicine).  Patients are able to view lab/test results, encounter notes, upcoming appointments, etc.  Non-urgent messages can be sent to your provider as well.   ?To learn more about what you can do with MyChart, go to [email or web address].   ? ?Your next appointment:  July ?  ? ?The format for your next appointment: Office ? ? ?Provider:  Dr.Branch in [place] ? ? ?Important Information About [place] ? ? ? ? ? ? ?

## 2021-08-29 NOTE — Progress Notes (Signed)
?Cardiology Office Note:   ? ?Date:  08/29/2021  ? ?ID:  Leon Dennis, DOB 04/12/1946, MRN [id number] ? ?PCP:  Pcp, No ?  ?[facility] Providers ?Cardiologist:  Janina Mayo, MD    ? ?Referring MD: No ref. provider found  ? ?No chief complaint on file. ?Afib ? ?History of Present Illness:   ? ?Leon Dennis is a 76 y.o. male with a hx of right sided heart failure, CKD Stage 3a, T2DM,  referral for afib ? ?Saw her PCP 2/28 with c/f PNA. Had bloating. His EKG showed afib, which is a new diagnosis. He was started on eliquis.  She also started him on lasix. He has RV failure with significant pulmonary HTN. No hx of PE ? ?Saw cardiology at [facility]. She saw Dr. Dianah Field for afib. No detailed note. ? ?He went to the ED 4/11 and saw his PCP Dr. Raquel Sarna on 4/13. He had LE edema and weight gain of 4 pounds. She the lasix that was started. The lasix showed some improvement. He continued to have abdominal distension. He has persistent DOE. ? ?He comes in today feeling tired and SOB. No orthopnea or PND. Continues to have abdominal edema. Still has pitting edema. This is challenging to manage at home. He is concerned about increasing his lasix with frequent urination. He's on room air. Not in acute distress. ? ? ?No COPD. No lung dx. No PE. He is planned for a sleep study he believes ? ?Interval Hx 08/29/2021: ?He was admitted after his previous visit for VO. He was diuresed.   Wt down from 92.7 kg to 84.4 Kg.  He did well during stay and on 08/22/21 underwent DCCV  X 3 shocks but was unsuccessful.  RHC was discussed with significant PH, but patient preferred to defer. He was dc'd on torsemide 20 mg daily. An antiarrhythmic was discussed but would require hospital stay which he deferred.  ? ?Today, he feels better.  Felt better after working on his constipation. Improved leg swelling. 87 kg today. Post hospital it was 186 pounds and has stayed this way. His sleep is improved. He picked up the sleep study unit, did  not work and has to repeat.  He plans to exercise soon ? ?[employer] ?08/03/2021 Cxray ?Persistent small right pleural effusion ? ? ?TTE  08/04/2021 ?EF 45-50 ?Moderately enlarged RV ?Severely Dilate RA ?RVSP 65  mmHg  ?Moderately dilated LA ?Mild AS ?Mild MR ?E/e' 18 ? ?07/31/2021 ?CXRAY infiltrates -right mid/lower lung fields ? ?CTA Abd/pelvis  ?CAC ? Small to moderate R pleural effusions. Compressive atelectasis of the right lung. Small ascites ? ?Current Medications: ?Current Outpatient Medications on File Prior to Visit  ?Medication Sig Dispense Refill  ? acetaminophen (TYLENOL) 500 MG tablet Take 1,000 mg by mouth every 4 (four) hours as needed for mild pain.    ? amLODipine (NORVASC) 10 MG tablet Take 10 mg by mouth daily.    ? apixaban (ELIQUIS) 5 MG TABS tablet Take 5 mg by mouth 2 (two) times daily.    ? busPIRone (BUSPAR) 5 MG tablet Take 5 mg by mouth 3 (three) times daily.    ? dapagliflozin propanediol (FARXIGA) 5 MG TABS tablet Take 5 mg by mouth daily.    ? glimepiride (AMARYL) 4 MG tablet Take 4 mg by mouth in the morning and at bedtime.    ? hydrALAZINE (APRESOLINE) 25 MG tablet Take 1 tablet (25 mg total) by mouth every 8 (eight) hours. 90 tablet 6  ?  lisinopril (ZESTRIL) 40 MG tablet Take 40 mg by mouth daily.    ? magnesium chloride (SLOW-MAG) 64 MG TBEC SR tablet Take 64 mg by mouth daily.    ? melatonin 5 MG TABS Take 5 mg by mouth at bedtime as needed (sleep).    ? metoprolol tartrate (LOPRESSOR) 100 MG tablet Take 100 mg by mouth 2 (two) times daily.    ? pioglitazone (ACTOS) 30 MG tablet Take 30 mg by mouth daily.    ? potassium chloride SA (KLOR-CON M) 20 MEQ tablet Take 2 tablets (40 mEq total) by mouth daily. 30 tablet 6  ? sodium chloride (OCEAN) 0.65 % SOLN nasal spray Place 1 spray into both nostrils as needed for congestion.    ? torsemide (DEMADEX) 20 MG tablet Take 1 tablet (20 mg total) by mouth daily. And as needed per insturctions 50 tablet 6  ? triamcinolone (NASACORT) 55  MCG/ACT AERO nasal inhaler Place 2 sprays into the nose daily as needed (allergies).    ? VITAMIN D PO Take 1 tablet by mouth daily.    ? ?No current facility-administered medications on file prior to visit.  ? ? ? ?Allergies:   Patient has no known allergies.  ? ?Social History  ? ?Socioeconomic History  ? Marital status: Married  ?  Spouse name: Not on file  ? Number of children: Not on file  ? Years of education: Not on file  ? Highest education level: Not on file  ?Occupational History  ? Not on file  ?Tobacco Use  ? Smoking status: Never  ? Smokeless tobacco: Never  ?Substance and Sexual Activity  ? Alcohol use: Yes  ?  Comment: [email or web address]  ? Drug use: Never  ? Sexual activity: Not on file  ?Other Topics Concern  ? Not on file  ?Social History Narrative  ? Not on file  ? ?Social Determinants of Health  ? ?Financial Resource Strain: Not on file  ?Food Insecurity: Not on file  ?Transportation Needs: Not on file  ?Physical Activity: Not on file  ?Stress: Not on file  ?Social Connections: Not on file  ?  ? ?Family History: ?The patient's reviewed, not pertinent ? ?ROS:   ?Please see the history of present illness.    ? All other systems reviewed and are negative. ? ?EKGs/Labs/Other Studies Reviewed:   ? ?The following studies were reviewed today: ? ? ?EKG:  EKG is  ordered today.  The ekg ordered today demonstrates  ? ?08/16/2021-Afib - rates 70s ? ?08/29/2021- Afib 77 ? ?Recent Labs: ?08/18/2021: B Natriuretic Peptide 1,394.0; Magnesium 2.0; TSH 2.788 ?08/21/2021: ALT 11 ?08/22/2021: BUN 21; Creatinine, Ser 1.39; Hemoglobin 14.4; Platelets 204; Potassium 3.2; Sodium 140  ?Recent Lipid Panel ?   ?Component Value Date/Time  ? CHOL 109 08/19/2021 0701  ? TRIG 50 08/19/2021 0701  ? HDL 47 08/19/2021 0701  ? CHOLHDL 2.3 08/19/2021 0701  ? VLDL 10 08/19/2021 0701  ? [place] 52 08/19/2021 0701  ? ? ? ?Risk Assessment/Calculations:   ? ?CHA2DS2-VASc Score = 5  ? This indicates a 7.2% annual risk of stroke. ?The patient's score is  based upon: ?CHF History: 1 ?HTN History: 1 ?Diabetes History: 1 ?Stroke History: 0 ?Vascular Disease History: 0 ?Age Score: 2 ?Gender Score: 0 ?  ? ? ?    ? ?Physical Exam:   ? ?VS:   ? ?Vitals:  ? 08/29/21 0848  ?BP: 100/63  ?Pulse: 77  ?SpO2: 96%  ? ? ? ?Wt Readings from  Last 3 Encounters:  ?08/29/21 192 lb (87.1 kg)  ?08/22/21 186 lb 1.6 oz (84.4 kg)  ?08/16/21 209 lb 3.2 oz (94.9 kg)  ?  ? ?GEN: Well nourished, no acute distress ?HEENT: Normal ?NECK: mild elevation JVD; No carotid bruits ?LYMPHATICS: No lymphadenopathy ?CARDIAC: irregularly irregular, no murmurs, rubs, gallops ?RESPIRATORY:  Nl wob, clear to auscultation ?ABDOMEN: Soft, non-tender, mild distension ?MUSCULOSKELETAL:  trace edema LE BL ?SKIN: Warm and dry ?NEUROLOGIC:  Alert and oriented x 3 ?PSYCHIATRIC:  Normal affect  ? ?ASSESSMENT:   ? ?HFpEF: New diagnosis. He has significant pulmonary HTN with RV dysfunction. He was admitted 08/18/2021 and dc'd 4/24. If continues to have dyspnea / LE edema can consider RHC; this was discussed in patient. ?- mostly euvolemic; still has some abdominal distension and edema; clinically is improved ?- continue torsemide 20 mg daily  ?- continue farxiga ?- repeat labs today ? ?Pulmonary HTN: Can consider further w/u with PFTs. Has Group II component. No known lung disease, no hx of PE. Planned for home sleep study per patient.  ? ?Afib: Paroxysmal. New onset. He's been on eliquis since 06/28/2021, no missed doses. Had DCCV in the hospital that was unsuccessful. Can consider amiodarone; will order  PFTs today. Will continue rate control for now, if decompensates again/RVR can consider repeat DCCV with initiation amiodarone as long as T bili normalizes with continued improvement ?- continue eliquis ?- cont metop ? ?HTN- continue lisinopril 20 mg daily, norvasc 10 mg daily, metop 100 mg tartrate BID, hydralazine 25 mg TID.  ? ?PLAN:   ? ?In order of problems listed above: ? ? ?Follow up in [place] in July ?Order PFTs,  will route to his PCP ?Pending sleep study ?CMP ? ?   ? ?  ?Medication Adjustments/Labs and Tests Ordered: ?Current medicines are reviewed at length with the patient today.  Concerns regarding medicines are ou

## 2021-09-02 ENCOUNTER — Ambulatory Visit: Payer: PRIVATE HEALTH INSURANCE | Admitting: Cardiology

## 2021-09-06 ENCOUNTER — Ambulatory Visit (HOSPITAL_COMMUNITY)
Admission: RE | Admit: 2021-09-06 | Discharge: 2021-09-06 | Disposition: A | Discharge status: Home / Self Care | Payer: Medicare Other | Source: Ambulatory Visit | Attending: Nephrology | Admitting: Nephrology

## 2021-09-06 DIAGNOSIS — D638 Anemia in other chronic diseases classified elsewhere: Secondary | ICD-10-CM

## 2021-09-06 DIAGNOSIS — E1129 Type 2 diabetes mellitus with other diabetic kidney complication: Secondary | ICD-10-CM | POA: Insufficient documentation

## 2021-09-06 DIAGNOSIS — R809 Proteinuria, unspecified: Secondary | ICD-10-CM | POA: Diagnosis present

## 2021-09-06 DIAGNOSIS — E1122 Type 2 diabetes mellitus with diabetic chronic kidney disease: Secondary | ICD-10-CM

## 2021-09-07 ENCOUNTER — Other Ambulatory Visit: Payer: Self-pay

## 2021-09-07 ENCOUNTER — Encounter (HOSPITAL_COMMUNITY): Payer: Self-pay

## 2021-09-07 ENCOUNTER — Emergency Department (HOSPITAL_COMMUNITY)
Admission: EM | Admit: 2021-09-07 | Discharge: 2021-09-07 | Disposition: A | Discharge status: Home / Self Care | Payer: Medicare Other | Attending: Emergency Medicine | Admitting: Emergency Medicine

## 2021-09-07 DIAGNOSIS — Z7984 Long term (current) use of oral hypoglycemic drugs: Secondary | ICD-10-CM | POA: Diagnosis not present

## 2021-09-07 DIAGNOSIS — E119 Type 2 diabetes mellitus without complications: Secondary | ICD-10-CM | POA: Diagnosis not present

## 2021-09-07 DIAGNOSIS — L03116 Cellulitis of left lower limb: Secondary | ICD-10-CM | POA: Insufficient documentation

## 2021-09-07 DIAGNOSIS — L089 Local infection of the skin and subcutaneous tissue, unspecified: Secondary | ICD-10-CM | POA: Diagnosis present

## 2021-09-07 DIAGNOSIS — Z7901 Long term (current) use of anticoagulants: Secondary | ICD-10-CM | POA: Insufficient documentation

## 2021-09-07 DIAGNOSIS — I509 Heart failure, unspecified: Secondary | ICD-10-CM | POA: Diagnosis not present

## 2021-09-07 HISTORY — DX: Heart failure, unspecified: I50.9

## 2021-09-07 HISTORY — DX: Essential (primary) hypertension: I10

## 2021-09-07 HISTORY — DX: Unspecified kidney failure: N19

## 2021-09-07 MED ORDER — DOXYCYCLINE HYCLATE 100 MG PO CAPS: 100.0000 mg | ORAL_CAPSULE | Freq: Two times a day (BID) | ORAL | 0 refills | Status: DC

## 2021-09-07 NOTE — Discharge Instructions (Signed)
Take doxycycline twice daily for the next 10 days. ?Follow-up with your primary care doctor later this week or next week for wound reevaluation.  Return to the ED for having fevers, spreading erythema after being on antibiotics for 48 hours or new or worsening symptoms. ?

## 2021-09-07 NOTE — ED Triage Notes (Signed)
Pt reports scratched leg near ankle Last week.  States redness around the wound.  Mild drainage per pt.  Denies fever.  Hx of dm2, chf, and pvd.  Resp even and unlabored.  Denies fevers.  Denies pain to site.  ?

## 2021-09-07 NOTE — ED Provider Notes (Signed)
?[facility] ?Provider Note ? ? ?CSN: [id number] ?Arrival date & time: 09/07/21  1245 ? ?  ? ?History ? ?Chief Complaint  ?Patient presents with  ? Wound Check  ? ? ?Leon Dennis is a 76 y.o. male. ? ? ?Wound Check ? ? ?Patient with medical history notable for chronic anticoagulation secondary to A-fib, type 2 diabetes, CHF presents today due to left ankle wound.  Patient states she scratched it on a toolbox about a week ago, has been having pain there for the last few days.  Denies any fevers at home, wife states she noticed some drainage but would not describe it as pus.  They have been applying topical mupirocin cream, patient is ambulatory.  ? ?Home Medications ?Prior to Admission medications   ?Medication Sig Start Date End Date Taking? Authorizing Provider  ?doxycycline (VIBRAMYCIN) 100 MG capsule Take 1 capsule (100 mg total) by mouth 2 (two) times daily. 09/07/21  Yes Theron Arista, PA-C  ?acetaminophen (TYLENOL) 500 MG tablet Take 1,000 mg by mouth every 4 (four) hours as needed for mild pain.    [provider]  ?amLODipine (NORVASC) 10 MG tablet Take 10 mg by mouth daily. 07/16/18   [provider]  ?apixaban (ELIQUIS) 5 MG TABS tablet Take 5 mg by mouth 2 (two) times daily.    [provider]  ?busPIRone (BUSPAR) 5 MG tablet Take 5 mg by mouth 3 (three) times daily. 08/04/21   [provider]  ?dapagliflozin propanediol (FARXIGA) 5 MG TABS tablet Take 5 mg by mouth daily. 08/11/21   [provider]  ?glimepiride (AMARYL) 4 MG tablet Take 4 mg by mouth in the morning and at bedtime. 07/05/18   [provider]  ?hydrALAZINE (APRESOLINE) 25 MG tablet Take 1 tablet (25 mg total) by mouth every 8 (eight) hours. 08/22/21   Leone Brand, NP  ?lisinopril (ZESTRIL) 40 MG tablet Take 40 mg by mouth daily. 08/11/21   [provider]  ?magnesium chloride (SLOW-MAG) 64 MG TBEC SR tablet Take 64 mg by mouth daily. 07/28/21   [provider]  ?melatonin 5 MG TABS Take 5 mg by mouth at bedtime as needed (sleep).    [provider]  ?metoprolol tartrate (LOPRESSOR) 100 MG tablet Take 100 mg by mouth 2 (two) times daily. 11/26/17   [provider]  ?pioglitazone (ACTOS) 30 MG tablet Take 30 mg by mouth daily.    [provider]  ?potassium chloride SA (KLOR-CON M) 20 MEQ tablet Take 2 tablets (40 mEq total) by mouth daily. 08/23/21   Leone Brand, NP  ?sodium chloride (OCEAN) 0.65 % SOLN nasal spray Place 1 spray into both nostrils as needed for congestion.    [provider]  ?torsemide (DEMADEX) 20 MG tablet Take 1 tablet (20 mg total) by mouth daily. And as needed per insturctions 08/22/21   Leone Brand, NP  ?triamcinolone (NASACORT) 55 MCG/ACT AERO nasal inhaler Place 2 sprays into the nose daily as needed (allergies).    [provider]  ?VITAMIN D PO Take 1 tablet by mouth daily.    [provider]  ?   ? ?Allergies    ?Patient has no known allergies.   ? ?Review of Systems   ?Review of Systems ? ?Physical Exam ?Updated Vital Signs ?BP 131/84   Pulse 92   Temp 97.7 ?F (36.5 ?C) (Oral)   Resp 16   Ht 5\' 8"  (1.727 m)   Wt 84.8  kg   SpO2 96%   BMI 28.43 kg/m?  ?Physical Exam ?Vitals and nursing note reviewed. Exam conducted with a chaperone present.  ?Constitutional:   ?   General: He is not in acute distress. ?   Appearance: Normal appearance.  ?HENT:  ?   Head: Normocephalic and atraumatic.  ?Eyes:  ?   General: No scleral icterus. ?   Extraocular Movements: Extraocular movements intact.  ?   Pupils: Pupils are equal, round, and reactive to light.  ?Cardiovascular:  ?   Comments: DP palpable but faint, roughly symmetric bilaterally ?Skin: ?   General: Skin is warm.  ?   Capillary Refill: Capillary refill takes 2 to 3 seconds.  ?   Coloration: Skin is not jaundiced.  ?   Findings: Erythema present.  ?Neurological:  ?   Mental Status: He is alert. Mental status is at  baseline.  ?   Coordination: Coordination normal.  ? ? ? ?ED Results / Procedures / Treatments   ?Labs ?(all labs ordered are listed, but only abnormal results are displayed) ?Labs Reviewed - No data to display ? ?EKG ?None ? ?Radiology ?No results found. ? ?Procedures ?Procedures  ? ? ?Medications Ordered in ED ?Medications - No data to display ? ?ED Course/ Medical Decision Making/ A&P ?  ?                        ?Medical Decision Making ?Risk ?Prescription drug management. ? ? ?Patient presents due to skin infection.  His care is complicated by comorbidities including chronic anticoagulation and diabetes.  On exam he is neurovascular intact although his pulses are weak they are symmetric bilaterally.  Skin is warm and erythematous consistent with cellulitis.  No underlying induration or fluctuance, not amenable to I&D.  Will discharge with outpatient antibiotic trial and close follow-up.  Considered imaging but given superficial nature I do not have a higher suspicion of osteomyelitis or deeper space infection at this time. ? ? ? ? ? ? ? ?Final Clinical Impression(s) / ED Diagnoses ?Final diagnoses:  ?Cellulitis of left foot  ? ? ?Rx / DC Orders ?ED Discharge Orders   ? ?      Ordered  ?  doxycycline (VIBRAMYCIN) 100 MG capsule  2 times daily       ? 09/07/21 1341  ? ?  ?  ? ?  ? ? ?  ?Theron Arista, PA-C ?09/07/21 1349 ? ?  ?Terrilee Files, MD ?09/07/21 1813 ? ?

## 2021-09-21 ENCOUNTER — Encounter: Payer: Self-pay | Admitting: Podiatry

## 2021-09-21 ENCOUNTER — Ambulatory Visit (INDEPENDENT_AMBULATORY_CARE_PROVIDER_SITE_OTHER): Payer: Medicare Other | Admitting: Podiatry

## 2021-09-21 DIAGNOSIS — M79675 Pain in left toe(s): Secondary | ICD-10-CM | POA: Diagnosis not present

## 2021-09-21 DIAGNOSIS — M79674 Pain in right toe(s): Secondary | ICD-10-CM

## 2021-09-21 DIAGNOSIS — D689 Coagulation defect, unspecified: Secondary | ICD-10-CM | POA: Diagnosis not present

## 2021-09-21 DIAGNOSIS — I739 Peripheral vascular disease, unspecified: Secondary | ICD-10-CM | POA: Diagnosis not present

## 2021-09-21 DIAGNOSIS — M205X1 Other deformities of toe(s) (acquired), right foot: Secondary | ICD-10-CM | POA: Insufficient documentation

## 2021-09-21 DIAGNOSIS — M205X2 Other deformities of toe(s) (acquired), left foot: Secondary | ICD-10-CM

## 2021-09-21 DIAGNOSIS — E1159 Type 2 diabetes mellitus with other circulatory complications: Secondary | ICD-10-CM | POA: Diagnosis not present

## 2021-09-21 DIAGNOSIS — B351 Tinea unguium: Secondary | ICD-10-CM | POA: Diagnosis not present

## 2021-09-21 NOTE — Progress Notes (Unsigned)
This patient presents to the office with chief complaint of long thick nails and diabetic feet.  This patient  says there  is  no pain and discomfort in their feet.  This patient says there are long thick painful nails.  These nails are painful walking and wearing shoes.  Patient has no history of infection or drainage from both feet.  Patient is unable to  self treat his own nails . 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  This patient presents  to the office today for treatment of the  long nails and a foot evaluation due to history of  diabetes.  General Appearance  Alert, conversant and in no acute stress.  Vascular  Dorsalis pedis and posterior tibial  pulses are absent  bilaterally.  Capillary return is within normal limits  bilaterally. Temperature is within normal limits  bilaterally.  Neurologic  Senn-Weinstein monofilament wire test within normal limits  bilaterally. Muscle power within normal limits bilaterally.  Nails Thick disfigured discolored nails with subungual debris  from hallux to fifth toes bilaterally. No evidence of bacterial infection or drainage bilaterally.  Orthopedic  No limitations of motion of motion feet .  No crepitus or effusions noted.  No bony pathology or digital deformities noted.  Hallux limitus 1st MPJ  B/L.  Skin  normotropic skin with no porokeratosis noted bilaterally.  No signs of infections or ulcers noted.     Onychomycosis  Diabetes with no foot complications  IE  Debride nails x 10.  A diabetic foot exam was performed and there is no evidence of any  neurologic pathology.  Patient is sent tp vascular lab to evaluate his vascular status both feet. RTC 3 months.   Helane Gunther DPM

## 2021-09-27 ENCOUNTER — Telehealth: Payer: Self-pay

## 2021-09-27 NOTE — Telephone Encounter (Signed)
CMN submitted ?

## 2021-10-04 ENCOUNTER — Ambulatory Visit (HOSPITAL_COMMUNITY): Payer: Medicare Other | Attending: Nephrology | Admitting: Physical Therapy

## 2021-10-04 ENCOUNTER — Encounter (HOSPITAL_COMMUNITY): Payer: Self-pay | Admitting: Physical Therapy

## 2021-10-04 ENCOUNTER — Ambulatory Visit (INDEPENDENT_AMBULATORY_CARE_PROVIDER_SITE_OTHER): Payer: Medicare Other | Admitting: Cardiology

## 2021-10-04 ENCOUNTER — Encounter (HOSPITAL_COMMUNITY): Payer: PRIVATE HEALTH INSURANCE

## 2021-10-04 ENCOUNTER — Encounter: Payer: Self-pay | Admitting: Cardiology

## 2021-10-04 VITALS — BP 120/64 | HR 98 | Ht 68.0 in | Wt 184.4 lb

## 2021-10-04 DIAGNOSIS — I4891 Unspecified atrial fibrillation: Secondary | ICD-10-CM | POA: Diagnosis not present

## 2021-10-04 DIAGNOSIS — S81801S Unspecified open wound, right lower leg, sequela: Secondary | ICD-10-CM | POA: Diagnosis not present

## 2021-10-04 DIAGNOSIS — X58XXXS Exposure to other specified factors, sequela: Secondary | ICD-10-CM | POA: Insufficient documentation

## 2021-10-04 DIAGNOSIS — I272 Pulmonary hypertension, unspecified: Secondary | ICD-10-CM | POA: Diagnosis not present

## 2021-10-04 DIAGNOSIS — I50810 Right heart failure, unspecified: Secondary | ICD-10-CM

## 2021-10-04 DIAGNOSIS — R2689 Other abnormalities of gait and mobility: Secondary | ICD-10-CM | POA: Insufficient documentation

## 2021-10-04 DIAGNOSIS — T148XXS Other injury of unspecified body region, sequela: Secondary | ICD-10-CM | POA: Diagnosis present

## 2021-10-04 NOTE — Progress Notes (Signed)
Clinical Summary Leon Dennis is a 76 y.o.male seen today for follow up of the following medical problems.   Afib - new diagnosis during 06/2021 visit with pcp - from notes prior trial of DCCV was unsuccesful 08/22/21 - no recent palpitations.  - home HRs 70s to 80s by his watch.    2. Chronic right sided HF - 07/2021 echo: LVEF 0000000, indet diastolic, mild to mod RV dysfunction, mod RV enlargement, moderate pulm HTN PASP 51. Severe LAE - from notes patient turned down RHC - wti 192-->184 lbs since 08/29/21 appt  - labs yesterday Cr 1.49. Was 1.54 at discharge. 2 months ago 1.16 - home weight 180-182 lbs and steady. Had been 187 lbs at discharge - no lightheadedness or dizziness. Taking torsemide 20mg  daily.  - long history of HTN age 49, severe LAE one cho, mild LVH  - awaiting sleep study. Has home device but has not started yet.  - no prior tobacco history - PFTs were ordered, he had to reschedule them   3. CKD 3A - followed by Dr Theador Hawthorne   4. DM2    Past Medical History:  Diagnosis Date   CHF (congestive heart failure) (HCC)    Diabetes mellitus without complication (HCC)    Hypertension    Kidney failure      No Known Allergies   Current Outpatient Medications  Medication Sig Dispense Refill   acetaminophen (TYLENOL) 500 MG tablet Take 1,000 mg by mouth every 4 (four) hours as needed for mild pain.     amLODipine (NORVASC) 10 MG tablet Take 10 mg by mouth daily.     apixaban (ELIQUIS) 5 MG TABS tablet Take 5 mg by mouth 2 (two) times daily.     busPIRone (BUSPAR) 5 MG tablet Take 5 mg by mouth 3 (three) times daily.     dapagliflozin propanediol (FARXIGA) 5 MG TABS tablet Take 5 mg by mouth daily.     doxycycline (VIBRAMYCIN) 100 MG capsule Take 1 capsule (100 mg total) by mouth 2 (two) times daily. 20 capsule 0   glimepiride (AMARYL) 4 MG tablet Take 4 mg by mouth in the morning and at bedtime.     hydrALAZINE (APRESOLINE) 25 MG tablet Take 1 tablet (25  mg total) by mouth every 8 (eight) hours. 90 tablet 6   lisinopril (ZESTRIL) 40 MG tablet Take 40 mg by mouth daily.     magnesium chloride (SLOW-MAG) 64 MG TBEC SR tablet Take 64 mg by mouth daily.     melatonin 5 MG TABS Take 5 mg by mouth at bedtime as needed (sleep).     metoprolol tartrate (LOPRESSOR) 100 MG tablet Take 100 mg by mouth 2 (two) times daily.     pioglitazone (ACTOS) 30 MG tablet Take 30 mg by mouth daily.     potassium chloride SA (KLOR-CON M) 20 MEQ tablet Take 2 tablets (40 mEq total) by mouth daily. 30 tablet 6   sodium chloride (OCEAN) 0.65 % SOLN nasal spray Place 1 spray into both nostrils as needed for congestion.     torsemide (DEMADEX) 20 MG tablet Take 1 tablet (20 mg total) by mouth daily. And as needed per insturctions 50 tablet 6   triamcinolone (NASACORT) 55 MCG/ACT AERO nasal inhaler Place 2 sprays into the nose daily as needed (allergies).     VITAMIN D PO Take 1 tablet by mouth daily.     No current facility-administered medications for this visit.  Past Surgical History:  Procedure Laterality Date   CARDIOVERSION N/A 08/22/2021   Procedure: CARDIOVERSION;  Surgeon: Freada Bergeron, MD;  Location: [facility] ENDOSCOPY;  Service: Cardiovascular;  Laterality: N/A;     No Known Allergies    No family history on file.   Social History Leon Dennis reports that he has never smoked. He has never used smokeless tobacco. Leon Dennis reports current alcohol use.   Review of Systems CONSTITUTIONAL: No weight loss, fever, chills, weakness or fatigue.  HEENT: Eyes: No visual loss, blurred vision, double vision or yellow sclerae.No hearing loss, sneezing, congestion, runny nose or sore throat.  SKIN: No rash or itching.  CARDIOVASCULAR: per hpi RESPIRATORY: per hpi GASTROINTESTINAL: No anorexia, nausea, vomiting or diarrhea. No abdominal pain or blood.  GENITOURINARY: No burning on urination, no polyuria NEUROLOGICAL: No headache, dizziness, syncope,  paralysis, ataxia, numbness or tingling in the extremities. No change in bowel or bladder control.  MUSCULOSKELETAL: No muscle, back pain, joint pain or stiffness.  LYMPHATICS: No enlarged nodes. No history of splenectomy.  PSYCHIATRIC: No history of depression or anxiety.  ENDOCRINOLOGIC: No reports of sweating, cold or heat intolerance. No polyuria or polydipsia.  Marland Kitchen   Physical Examination Today's Vitals   10/04/21 1542  BP: 120/64  Pulse: 98  SpO2: 99%  Weight: 184 lb 6.4 oz (83.6 kg)  Height: 5\' 8"  (1.727 m)   Body mass index is 28.04 kg/m.  Gen: resting comfortably, no acute distress HEENT: no scleral icterus, pupils equal round and reactive, no palptable cervical adenopathy,  CV: irreg, no m/r/ gno jvd Resp: Clear to auscultation bilaterally GI: abdomen is soft, non-tender, non-distended, normal bowel sounds, no hepatosplenomegaly MSK: extremities are warm, no edema.  Skin: warm, no rash Neuro:  no focal deficits Psych: appropriate affect     Assessment and Plan  1.Chronic RV failure/Pulmonary HTN/HFpEF - moderate pulm HTN by echo with mild to moderate RV dysfunction. Could not interpret diastolic fxn by echo but with severe LAE would suggest long standing elevated LA pressures. - awaiting PFTs, sleep study. He has continued to want to defer on RHC. Likely to have a group II component given severe LAE, will look for other potential causes. Perhaps VQ as well in the future - euvolemic today, Cr remains elevated. We will lower his tosremide to 20mg  every other day, take daily if weights above 186lbs by home scale. Change KCl to every other day   2. Afib - failed DCCV while admitted - rates controlled on lopressor alone, asymptomatic - do not see strong indication to pursue rhythm control at this time,continue rate control - continue eliquis for stroke prevention.       Arnoldo Lenis, M.D.

## 2021-10-04 NOTE — Therapy (Signed)
[place] [facility] [address] [place], [place], [address] Phone: [phone]   Fax:  [phone]  Wound Care Evaluation  Patient Details  Name: Leon Dennis MRN: [id number] Date of Birth: 01/09/1946 Referring Provider (PT): Celso AmyManpreet Bhutani MD   Encounter Date: 10/04/2021   PT End of Session - 10/04/21 1347     Visit Number 1    Number of Visits 12    Date for PT Re-Evaluation 11/15/21    Authorization Type Primary Medicare Secondary [employer] MCR Sup    Progress Note Due on Visit 10    PT Start Time 1348    PT Stop Time 1435    PT Time Calculation (min) 47 min    Activity Tolerance Patient tolerated treatment well    Behavior During Therapy [facility] for tasks assessed/performed             Past Medical History:  Diagnosis Date   CHF (congestive heart failure) (HCC)    Diabetes mellitus without complication (HCC)    Hypertension    Kidney failure     Past Surgical History:  Procedure Laterality Date   CARDIOVERSION N/A 08/22/2021   Procedure: CARDIOVERSION;  Surgeon: Meriam SpraguePemberton, Heather E, MD;  [facility];  Service: Cardiovascular;  Laterality: N/A;    There were no vitals filed for this visit.     [facility] - 10/04/21 0001       Assessment   Medical Diagnosis Open wound of R lower leg    Referring Provider (PT) Celso AmyManpreet Bhutani MD    Onset Date/Surgical Date 09/03/21             Wound Therapy - 10/04/21 0001     Subjective Patient's with wound that developed about a month ago after hitting R ankle on tool box. Has kept it clean and dressed.    Patient and Family Stated Goals wound to heal    Date of Onset 09/03/21    Prior Treatments antibiotics, dressing changes, various ointments/medihoney    Pain Scale Faces    Faces Pain Scale Hurts whole lot    Pain Type Acute pain    Pain Location Ankle    Pain Descriptors / Indicators Grimacing    Evaluation and Treatment Procedures Explained to  Patient/Family Yes    Evaluation and Treatment Procedures agreed to    Wound Properties Date First Assessed: 10/04/21 Time First Assessed: 1400 Wound Type: Skin tear Location: Pretibial Location Orientation: Right;Lateral Wound Description (Comments): R lateral ankle wound- superior Present on Admission: Yes   Wound Image Images linked: 1    Dressing Type Gauze (Comment)    Dressing Changed Changed    Dressing Status Old drainage    Dressing Change Frequency PRN    Site / Wound Assessment Black;Red;Painful;Yellow    % Wound base Red or Granulating --   following debridement   % Wound base Yellow/Fibrinous Exudate 90%    % Wound base Black/Eschar 10%    Peri-wound Assessment Erythema (blanchable);Other (Comment)   dry   Wound Length (cm) 1.2 cm    Wound Width (cm) 1.1 cm    Wound Surface Area (cm^2) 1.32 cm^2    Drainage Amount Scant    Drainage Description Serous    Treatment Cleansed;Debridement (Selective)    Wound Properties Date First Assessed: 10/04/21 Time First Assessed: 1400 Wound Type: Skin tear Location: Pretibial Location Orientation: Right;Lateral Wound Description (Comments): R lateral ankle wound - Inferior   Dressing Type Gauze (Comment)  Dressing Changed Changed    Dressing Status Old drainage    Dressing Change Frequency PRN    Site / Wound Assessment Black;Red;Yellow    % Wound base Red or Granulating 5%    % Wound base Yellow/Fibrinous Exudate 5%    % Wound base Black/Eschar 90%    Peri-wound Assessment Erythema (blanchable)    Wound Length (cm) 3.2 cm    Wound Width (cm) 2.5 cm    Wound Surface Area (cm^2) 8 cm^2    Drainage Amount None    Treatment Cleansed;Debridement (Selective)    Selective Debridement - Location R ankle wound beds and periwound    Selective Debridement - Tools Used Forceps;Scalpel    Selective Debridement - Tissue Removed eschar, dry skin    Wound Therapy - Clinical Statement Patient with 2 lateral ankle wounds covered in eschar and dry  skin at periwound. Able to debride all of eschar off superior wound revealing mostly granulation below. For inferior wound, able to debride edges of eschar but it is very adherent. Scored eschar to increase surface area for medihoney and dressed with medihoney on 4x4 to inferior wound and xeroform to superior wound followed by kerlix, tape to secure and netting. Patient will benefit from PT to promote wound healing.    Wound Therapy - Functional Problem List ambulation, sleep    Factors Delaying/Impairing Wound Healing Altered sensation;Diabetes Mellitus;Infection - systemic/local;Multiple medical problems;Vascular compromise    Hydrotherapy Plan Debridement;Dressing change;Electrical stimulation;Pulsatile lavage with suction;Ultrasonic wound therapy @35  KHz (+/- 3 KHz);Patient/family education    Wound Therapy - Frequency 2X / week    Wound Therapy - Current Recommendations PT    Wound Plan debride and dressing changes PRN    Dressing  inferior: medihoney on 4x4; superior xeroform, kerlix, tape, #5 netting                   Objective measurements completed on examination: See above findings.             PT Education - 10/04/21 1438     Education Details Educated on exam findings, POC, changing dressing if soiled, keeping wound clean and dressed    Person(s) Educated Patient;Spouse    Methods Explanation    Comprehension Verbalized understanding              PT Short Term Goals - 10/04/21 1441       PT SHORT TERM GOAL #1   Title Patient's wounds to be free from eschar.    Time 2    Period Weeks    Status New    Target Date 10/18/21               PT Long Term Goals - 10/04/21 1442       PT LONG TERM GOAL #1   Title Patient's wound to be healed    Time 6    Period Weeks    Status New    Target Date 11/15/21                   Plan - 10/04/21 1439     Clinical Impression Statement see above    Personal Factors and Comorbidities  Comorbidity 3+;Time since onset of injury/illness/exacerbation    Comorbidities neuropathy, DM, CKD, CHF, anemia, afib, blood clotting disorder, PVD, pulmonary HTN    Examination-Activity Limitations Transfers;Stairs;Squat;Locomotion Level;Hygiene/Grooming    Examination-Participation Restrictions Community Activity;Yard Work    12/04/21  Moderate    Rehab Potential Good    PT Frequency 2x / week    PT Duration 6 weeks    PT Treatment/Interventions ADLs/Self Care Home Management;Gait training;DME Instruction;Stair training;Functional mobility training;Therapeutic activities;Therapeutic exercise;Balance training;Neuromuscular re-education;Patient/family education;Manual techniques;Manual lymph drainage;Compression bandaging;Scar mobilization;Passive range of motion;Dry needling;Taping;Splinting;Orthotic Fit/Training    PT Next Visit Plan debride and dressing changes    Consulted and Agree with Plan of Care Patient;Family member/caregiver    Family Member Consulted Wife             Patient will benefit from skilled therapeutic intervention in order to improve the following deficits and impairments:  Decreased skin integrity, Pain, Decreased mobility  Visit Diagnosis: Open wound of right lower leg, sequela  Other abnormalities of gait and mobility    Problem List Patient Active Problem List   Diagnosis Date Noted   Pain due to onychomycosis of toenails of both feet 09/21/2021   Type 2 diabetes mellitus with vascular disease (HCC) 09/21/2021   Acquired hallux limitus of both feet 09/21/2021   Blood clotting disorder (HCC) 09/21/2021   PVD (peripheral vascular disease) (HCC) 09/21/2021   Acute diastolic HF (heart failure) (HCC)    PAH (pulmonary artery hypertension) (HCC)    Right-sided heart failure (HCC) 08/18/2021   Paroxysmal atrial fibrillation (HCC) 08/18/2021   Hypertension 08/18/2021    Pulmonary hypertension, unspecified (HCC) 08/18/2021    Wyman Songster, PT 10/04/2021, 3:37 PM  [facility] [address] [place], [place], [address] Phone: [phone]   Fax:  [phone]  Name: Buell Parcel MRN: [id number] Date of Birth: 1945-06-25

## 2021-10-04 NOTE — Patient Instructions (Signed)
Medication Instructions:  Your physician has recommended you make the following change in your medication:  Start taking torsemide 20 mg every other day. If weight is above 186, take once a day. Start taking potassium 40 meq every other day Continue all medications as directed  Labwork: none  Testing/Procedures: none  Follow-Up: Your physician recommends that you schedule a follow-up appointment in: 2 months  Any Other Special Instructions Will Be Listed Below (If Applicable).  If you need a refill on your cardiac medications before your next appointment, please call your pharmacy.

## 2021-10-06 ENCOUNTER — Ambulatory Visit (HOSPITAL_COMMUNITY): Payer: Medicare Other | Attending: Nephrology

## 2021-10-06 ENCOUNTER — Encounter (HOSPITAL_COMMUNITY): Payer: Self-pay

## 2021-10-06 DIAGNOSIS — R809 Proteinuria, unspecified: Secondary | ICD-10-CM | POA: Diagnosis not present

## 2021-10-06 DIAGNOSIS — Y939 Activity, unspecified: Secondary | ICD-10-CM | POA: Diagnosis not present

## 2021-10-06 DIAGNOSIS — S81801S Unspecified open wound, right lower leg, sequela: Secondary | ICD-10-CM

## 2021-10-06 DIAGNOSIS — D631 Anemia in chronic kidney disease: Secondary | ICD-10-CM | POA: Diagnosis not present

## 2021-10-06 DIAGNOSIS — S81801D Unspecified open wound, right lower leg, subsequent encounter: Secondary | ICD-10-CM | POA: Diagnosis not present

## 2021-10-06 DIAGNOSIS — E1129 Type 2 diabetes mellitus with other diabetic kidney complication: Secondary | ICD-10-CM | POA: Diagnosis not present

## 2021-10-06 DIAGNOSIS — N200 Calculus of kidney: Secondary | ICD-10-CM | POA: Diagnosis not present

## 2021-10-06 DIAGNOSIS — Q618 Other cystic kidney diseases: Secondary | ICD-10-CM | POA: Insufficient documentation

## 2021-10-06 DIAGNOSIS — I5042 Chronic combined systolic (congestive) and diastolic (congestive) heart failure: Secondary | ICD-10-CM | POA: Insufficient documentation

## 2021-10-06 DIAGNOSIS — N189 Chronic kidney disease, unspecified: Secondary | ICD-10-CM | POA: Diagnosis not present

## 2021-10-06 DIAGNOSIS — E1122 Type 2 diabetes mellitus with diabetic chronic kidney disease: Secondary | ICD-10-CM | POA: Insufficient documentation

## 2021-10-06 DIAGNOSIS — R2689 Other abnormalities of gait and mobility: Secondary | ICD-10-CM

## 2021-10-06 NOTE — Therapy (Signed)
[facility] [address] [place], [place], [address] Phone: [phone]   Fax:  [phone]  Wound Care Therapy  Patient Details  Name: Leon Dennis MRN: [id number] Date of Birth: Jul 15, 1945 Referring Provider (PT): Celso Amy MD   Encounter Date: 10/06/2021   PT End of Session - 10/06/21 1622     Visit Number 2    Number of Visits 12    Date for PT Re-Evaluation 11/15/21    Authorization Type Primary Medicare Secondary [employer] Sup    Progress Note Due on Visit 10    PT Start Time 1440    PT Stop Time 1513    PT Time Calculation (min) 33 min    Activity Tolerance Patient tolerated treatment well    Behavior During Therapy WFL for tasks assessed/performed             Past Medical History:  Diagnosis Date   CHF (congestive heart failure) (HCC)    Diabetes mellitus without complication (HCC)    Hypertension    Kidney failure     Past Surgical History:  Procedure Laterality Date   CARDIOVERSION N/A 08/22/2021   Procedure: CARDIOVERSION;  Surgeon: Meriam Sprague, MD;  Location: [facility] ENDOSCOPY;  Service: Cardiovascular;  Laterality: N/A;    There were no vitals filed for this visit.    Subjective Assessment - 10/06/21 1616     Subjective Pt arrived with wife, dressings intact.  Current pain scale 5/10 stinging pain.  Reports netting bothered him above dressings at night.    Currently in Pain? Yes    Pain Score 5     Pain Location Ankle    Pain Orientation Right    Pain Descriptors / Indicators Throbbing;Burning    Pain Type Acute pain                       Wound Therapy - 10/06/21 1616     Subjective Pt arrived with wife, dressings intact.  Current pain scale 5/10 stinging pain.  Reports netting bothered him above dressings at night.    Patient and Family Stated Goals wound to heal    Date of Onset 09/03/21    Prior Treatments antibiotics, dressing changes, various  ointments/medihoney    Pain Scale 0-10    Evaluation and Treatment Procedures Explained to Patient/Family Yes    Evaluation and Treatment Procedures agreed to    Wound Properties Date First Assessed: 10/04/21 Time First Assessed: 1400 Wound Type: Skin tear Location: Pretibial Location Orientation: Right;Lateral Wound Description (Comments): R lateral ankle wound- superior Present on Admission: Yes   Wound Image Images linked: 1    Dressing Type Bismuth petroleum;Gauze (Comment)    Dressing Changed Changed    Dressing Status Old drainage    Dressing Change Frequency PRN    Site / Wound Assessment Black;Red;Painful    % Wound base Red or Granulating 20%    % Wound base Yellow/Fibrinous Exudate 75%    % Wound base Black/Eschar 5%    Peri-wound Assessment Erythema (blanchable);Other (Comment)    Drainage Amount Scant    Drainage Description Serous    Treatment Cleansed;Debridement (Selective)    Wound Properties Date First Assessed: 10/04/21 Time First Assessed: 1400 Wound Type: Skin tear Location: Pretibial Location Orientation: Right;Lateral Wound Description (Comments): R lateral ankle wound - Inferior   Dressing Type Gauze (Comment)   Medihoney, 4x4, gauze   Dressing Changed Changed    Dressing Status Old  drainage    Dressing Change Frequency PRN    Site / Wound Assessment Black;Yellow;Red    % Wound base Red or Granulating 15%    % Wound base Black/Eschar 85%    Peri-wound Assessment Erythema (blanchable)    Drainage Amount None    Treatment Cleansed;Debridement (Selective)    Selective Debridement - Location R ankle wound beds and periwound    Selective Debridement - Tools Used Forceps;Scalpel    Selective Debridement - Tissue Removed eschar, dry skin    Wound Therapy - Clinical Statement Selective debridement perimeter of slough to assist with removal, added crosshatching over slough with medihoney to assist with removal.  Continued xeroform superior wound.  Applied gauze higher on  shin for comfort on netting per pt.'s request.  Pt stated it feels better at EOS.    Wound Therapy - Functional Problem List ambulation, sleep    Factors Delaying/Impairing Wound Healing Altered sensation;Diabetes Mellitus;Infection - systemic/local;Multiple medical problems;Vascular compromise    Hydrotherapy Plan Debridement;Dressing change;Electrical stimulation;Pulsatile lavage with suction;Ultrasonic wound therapy @35  KHz (+/- 3 KHz);Patient/family education    Wound Therapy - Frequency 2X / week    Wound Therapy - Current Recommendations PT    Wound Plan debride and dressing changes PRN    Dressing  inferior: medihoney on 4x4; superior xeroform, kerlix, tape, #5 netting                       PT Short Term Goals - 10/04/21 1441       PT SHORT TERM GOAL #1   Title Patient's wounds to be free from eschar.    Time 2    Period Weeks    Status New    Target Date 10/18/21               PT Long Term Goals - 10/04/21 1442       PT LONG TERM GOAL #1   Title Patient's wound to be healed    Time 6    Period Weeks    Status New    Target Date 11/15/21                    Patient will benefit from skilled therapeutic intervention in order to improve the following deficits and impairments:     Visit Diagnosis: Open wound of right lower leg, sequela  Other abnormalities of gait and mobility     Problem List Patient Active Problem List   Diagnosis Date Noted   Pain due to onychomycosis of toenails of both feet 09/21/2021   Type 2 diabetes mellitus with vascular disease (HCC) 09/21/2021   Acquired hallux limitus of both feet 09/21/2021   Blood clotting disorder (HCC) 09/21/2021   PVD (peripheral vascular disease) (HCC) 09/21/2021   Acute diastolic HF (heart failure) (HCC)    PAH (pulmonary artery hypertension) (HCC)    Right-sided heart failure (HCC) 08/18/2021   Paroxysmal atrial fibrillation (HCC) 08/18/2021   Hypertension 08/18/2021    Pulmonary hypertension, unspecified (HCC) 08/18/2021   08/20/2021, LPTA/CLT; CBIS [phone]  [phone], PTA 10/06/2021, 4:24 PM  [facility] [address] [place], [place], [place] Phone: [phone]   Fax:  [phone]  Name: Leon Dennis MRN: [name] Date of Birth: 07/07/1945

## 2021-10-10 ENCOUNTER — Ambulatory Visit (HOSPITAL_COMMUNITY): Payer: Medicare Other | Attending: Nephrology | Admitting: Physical Therapy

## 2021-10-10 DIAGNOSIS — N189 Chronic kidney disease, unspecified: Secondary | ICD-10-CM | POA: Diagnosis not present

## 2021-10-10 DIAGNOSIS — N200 Calculus of kidney: Secondary | ICD-10-CM | POA: Insufficient documentation

## 2021-10-10 DIAGNOSIS — R2689 Other abnormalities of gait and mobility: Secondary | ICD-10-CM

## 2021-10-10 DIAGNOSIS — D631 Anemia in chronic kidney disease: Secondary | ICD-10-CM | POA: Diagnosis not present

## 2021-10-10 DIAGNOSIS — I5042 Chronic combined systolic (congestive) and diastolic (congestive) heart failure: Secondary | ICD-10-CM | POA: Insufficient documentation

## 2021-10-10 DIAGNOSIS — I480 Paroxysmal atrial fibrillation: Secondary | ICD-10-CM | POA: Diagnosis present

## 2021-10-10 DIAGNOSIS — I130 Hypertensive heart and chronic kidney disease with heart failure and stage 1 through stage 4 chronic kidney disease, or unspecified chronic kidney disease: Secondary | ICD-10-CM | POA: Diagnosis not present

## 2021-10-10 DIAGNOSIS — E1159 Type 2 diabetes mellitus with other circulatory complications: Secondary | ICD-10-CM | POA: Insufficient documentation

## 2021-10-10 DIAGNOSIS — R809 Proteinuria, unspecified: Secondary | ICD-10-CM | POA: Diagnosis not present

## 2021-10-10 DIAGNOSIS — E1122 Type 2 diabetes mellitus with diabetic chronic kidney disease: Secondary | ICD-10-CM | POA: Insufficient documentation

## 2021-10-10 DIAGNOSIS — S81801D Unspecified open wound, right lower leg, subsequent encounter: Secondary | ICD-10-CM | POA: Diagnosis not present

## 2021-10-10 DIAGNOSIS — Q618 Other cystic kidney diseases: Secondary | ICD-10-CM | POA: Diagnosis not present

## 2021-10-10 DIAGNOSIS — M21969 Unspecified acquired deformity of unspecified lower leg: Secondary | ICD-10-CM | POA: Insufficient documentation

## 2021-10-10 DIAGNOSIS — Y939 Activity, unspecified: Secondary | ICD-10-CM | POA: Insufficient documentation

## 2021-10-10 DIAGNOSIS — S81801S Unspecified open wound, right lower leg, sequela: Secondary | ICD-10-CM

## 2021-10-10 NOTE — Therapy (Signed)
[facility] [address] [place], [place], [address] Phone: [phone]   Fax:  [phone]  Wound Care Therapy  Patient Details  Name: Andru Genter MRN: [id number] Date of Birth: [birth year] Referring Provider (PT): Celso Milik Gilreath MD   Encounter Date: 10/10/2021   PT End of Session - 10/10/21 1531     Visit Number 3    Number of Visits 12    Date for PT Re-Evaluation 11/15/21    Authorization Type Primary Medicare Secondary [employer] Sup    Progress Note Due on Visit 10    PT Start Time 1450    PT Stop Time 1520    PT Time Calculation (min) 30 min    Activity Tolerance Patient tolerated treatment well    Behavior During Therapy [facility] for tasks assessed/performed             Past Medical History:  Diagnosis Date   CHF (congestive heart failure) (HCC)    Diabetes mellitus without complication (HCC)    Hypertension    Kidney failure     Past Surgical History:  Procedure Laterality Date   CARDIOVERSION N/A 08/22/2021   Procedure: CARDIOVERSION;  Surgeon: Meriam Sprague, MD;  Location: [facility] ENDOSCOPY;  Service: Cardiovascular;  Laterality: N/A;    There were no vitals filed for this visit.               Wound Therapy - 10/10/21 1532     Subjective Pt arrived with wife, reports she changed it yesterday. Pt reports stings alot but better with meds.    Patient and Family Stated Goals wound to heal    Date of Onset 09/03/21    Prior Treatments antibiotics, dressing changes, various ointments/medihoney    Pain Scale 0-10    Pain Score 5     Pain Type Acute pain    Pain Location Ankle    Pain Orientation Right    Pain Descriptors / Indicators Throbbing    Evaluation and Treatment Procedures Explained to Patient/Family Yes    Evaluation and Treatment Procedures agreed to    Wound Properties Date First Assessed: 10/04/21 Time First Assessed: 1400 Wound Type: Skin tear Location: Pretibial Location  Orientation: Right;Lateral Wound Description (Comments): R lateral ankle wound- superior Present on Admission: Yes   Dressing Type Bismuth petroleum;Gauze (Comment)    Dressing Changed Changed    Dressing Status Old drainage    Dressing Change Frequency PRN    Site / Wound Assessment Black;Red;Painful    % Wound base Red or Granulating 20%    % Wound base Yellow/Fibrinous Exudate 75%    % Wound base Black/Eschar 5%    Peri-wound Assessment Erythema (blanchable);Other (Comment)    Drainage Amount Scant    Drainage Description Serous    Wound Properties Date First Assessed: 10/04/21 Time First Assessed: 1400 Wound Type: Skin tear Location: Pretibial Location Orientation: Right;Lateral Wound Description (Comments): R lateral ankle wound - Inferior   Dressing Type Gauze (Comment)   Medihoney, 4x4, gauze   Dressing Changed Changed    Dressing Status Old drainage    Dressing Change Frequency PRN    Site / Wound Assessment Black;Yellow;Red    % Wound base Red or Granulating 15%    % Wound base Yellow/Fibrinous Exudate 20%    % Wound base Black/Eschar 65%    Peri-wound Assessment Erythema (blanchable)    Drainage Amount Scant    Drainage Description Serous    Treatment Cleansed;Debridement (Selective)  Selective Debridement - Location R ankle wound beds and periwound    Selective Debridement - Tools Used Forceps;Scalpel    Selective Debridement - Tissue Removed eschar, dry skin    Wound Therapy - Clinical Statement Cleansed and debrided large amount of eschar from larger wound.  Smaller wound was super sensitive this session and only able to tolerate minimal debridement.  Advised to not shower and remove bandaging; leave intact and let therapist change it only.  Pt and spouse verbalized understanding.  Used medihoney on both wounds this session, no xerform.  Vaseline applied to perimeter to protect skin.    Wound Therapy - Functional Problem List ambulation, sleep    Factors  Delaying/Impairing Wound Healing Altered sensation;Diabetes Mellitus;Infection - systemic/local;Multiple medical problems;Vascular compromise    Hydrotherapy Plan Debridement;Dressing change;Electrical stimulation;Pulsatile lavage with suction;Ultrasonic wound therapy @35  KHz (+/- 3 KHz);Patient/family education    Wound Therapy - Frequency 2X / week    Wound Therapy - Current Recommendations PT    Wound Plan debride and dressing changes PRN    Dressing  both wounds: medihoney on 4x4,kerlix, tape, #5 netting                       PT Short Term Goals - 10/04/21 1441       PT SHORT TERM GOAL #1   Title Patient's wounds to be free from eschar.    Time 2    Period Weeks    Status New    Target Date 10/18/21               PT Long Term Goals - 10/04/21 1442       PT LONG TERM GOAL #1   Title Patient's wound to be healed    Time 6    Period Weeks    Status New    Target Date 11/15/21                    Patient will benefit from skilled therapeutic intervention in order to improve the following deficits and impairments:     Visit Diagnosis: Open wound of right lower leg, sequela  Other abnormalities of gait and mobility     Problem List Patient Active Problem List   Diagnosis Date Noted   Pain due to onychomycosis of toenails of both feet 09/21/2021   Type 2 diabetes mellitus with vascular disease (HCC) 09/21/2021   Acquired hallux limitus of both feet 09/21/2021   Blood clotting disorder (HCC) 09/21/2021   PVD (peripheral vascular disease) (HCC) 09/21/2021   Acute diastolic HF (heart failure) (HCC)    PAH (pulmonary artery hypertension) (HCC)    Right-sided heart failure (HCC) 08/18/2021   Paroxysmal atrial fibrillation (HCC) 08/18/2021   Hypertension 08/18/2021   Pulmonary hypertension, unspecified (HCC) 08/18/2021    08/20/2021, PTA 10/10/2021, 3:53 PM Tatyana Biber 12/10/2021, PTA/CLT [facility]  [place] Ph: [phone]  [facility] [address] [place], [place], [place] Phone: [phone]   Fax:  [phone]  Name: Lynard Postlewait MRN: [name] Date of Birth: 09/21/1945

## 2021-10-12 ENCOUNTER — Ambulatory Visit (HOSPITAL_COMMUNITY): Payer: PRIVATE HEALTH INSURANCE

## 2021-10-14 ENCOUNTER — Ambulatory Visit (HOSPITAL_COMMUNITY): Payer: Medicare Other | Attending: Nephrology | Admitting: Physical Therapy

## 2021-10-14 DIAGNOSIS — R2689 Other abnormalities of gait and mobility: Secondary | ICD-10-CM | POA: Insufficient documentation

## 2021-10-14 DIAGNOSIS — S81801S Unspecified open wound, right lower leg, sequela: Secondary | ICD-10-CM | POA: Insufficient documentation

## 2021-10-14 DIAGNOSIS — X58XXXS Exposure to other specified factors, sequela: Secondary | ICD-10-CM | POA: Insufficient documentation

## 2021-10-14 NOTE — Therapy (Signed)
[facility] [address] [place], [place], [address] Phone: [phone]   Fax:  [phone]  Wound Care Therapy  Patient Details  Name: Leon Dennis MRN: [id number] Date of Birth: 1946-01-15 Referring Provider (PT): Celso Amy MD   Encounter Date: 10/14/2021   PT End of Session - 10/14/21 1315     Visit Number 4    Number of Visits 12    Date for PT Re-Evaluation 11/15/21    Authorization Type Primary Medicare Secondary [employer] M[employer] Sup    Progress Note Due on Visit 10    PT Start Time 1315    PT Stop Time 1350    PT Time Calculation (min) 35 min    Activity Tolerance Patient tolerated treatment well    Behavior During Therapy WFL for tasks assessed/performed             Past Medical History:  Diagnosis Date   CHF (congestive heart failure) (HCC)    Diabetes mellitus without complication (HCC)    Hypertension    Kidney failure     Past Surgical History:  Procedure Laterality Date   CARDIOVERSION N/A 08/22/2021   Procedure: CARDIOVERSION;  Surgeon: Meriam Sprague, MD;  Location: [facility] ENDOSCOPY;  Service: Cardiovascular;  Laterality: N/A;    There were no vitals filed for this visit.               Wound Therapy - 10/14/21 0001     Subjective PT has no complaint states that his wife put an ab pad on his wound and the pain has decreased some    Patient and Family Stated Goals wound to heal    Date of Onset 09/03/21    Prior Treatments antibiotics, dressing changes, various ointments/medihoney    Pain Scale 0-10    Pain Score 3     Pain Type Acute pain    Pain Location Ankle    Pain Orientation Right;Lateral    Evaluation and Treatment Procedures Explained to Patient/Family Yes    Evaluation and Treatment Procedures agreed to    Wound Properties Date First Assessed: 10/04/21 Time First Assessed: 1400 Wound Type: Skin tear Location: Pretibial Location Orientation: Right;Lateral Wound  Description (Comments): R lateral ankle wound- superior Present on Admission: Yes   Dressing Type Bismuth petroleum;Gauze (Comment)    Dressing Changed Changed    Dressing Status Old drainage    Dressing Change Frequency PRN    Site / Wound Assessment Black;Red;Painful    % Wound base Red or Granulating 20%    % Wound base Yellow/Fibrinous Exudate 80%    Peri-wound Assessment Erythema (blanchable);Other (Comment)    Drainage Amount Scant    Drainage Description Serous    Treatment Cleansed;Debridement (Selective)    Wound Properties Date First Assessed: 10/04/21 Time First Assessed: 1400 Wound Type: Skin tear Location: Pretibial Location Orientation: Right;Lateral Wound Description (Comments): R lateral ankle wound - Inferior   Dressing Type Gauze (Comment)   Medihoney, 4x4, gauze   Dressing Changed Changed    Dressing Status Old drainage    Dressing Change Frequency PRN    Site / Wound Assessment Black;Yellow;Red    % Wound base Red or Granulating 10%    % Wound base Yellow/Fibrinous Exudate 20%    % Wound base Black/Eschar 70%    Peri-wound Assessment Erythema (blanchable)    Drainage Amount None    Treatment Cleansed;Debridement (Selective)    Selective Debridement - Location R ankle wound beds and periwound  Selective Debridement - Tools Used Forceps;Scalpel    Selective Debridement - Tissue Removed eschar, dry skin    Wound Therapy - Clinical Statement Wound continues to be very adherent with minimal debridement completed with sharp debriedment.  Therapist contacted MD office re ordering santyl for chemical debridement.  Order will be sent to [facility].    Wound Therapy - Functional Problem List ambulation, sleep    Factors Delaying/Impairing Wound Healing Altered sensation;Diabetes Mellitus;Infection - systemic/local;Multiple medical problems;Vascular compromise    Hydrotherapy Plan Debridement;Dressing change;Electrical stimulation;Pulsatile lavage with suction;Ultrasonic wound  therapy @35  KHz (+/- 3 KHz);Patient/family education    Wound Therapy - Frequency 2X / week    Wound Therapy - Current Recommendations PT    Wound Plan Begin using santyl as soon as possible.  Wife is a retired therefore once santyl is in give pt santyl to use daily and bring to therapy.    Dressing  both wounds: medihoney on 4x4, ab pad, kerlix, tape, #5 netting                       PT Short Term Goals - 10/14/21 1352       PT SHORT TERM GOAL #1   Title Patient's wounds to be free from eschar.    Time 2    Period Weeks    Status On-going    Target Date 10/18/21               PT Long Term Goals - 10/14/21 1352       PT LONG TERM GOAL #1   Title Patient's wound to be healed    Time 6    Period Weeks    Status On-going    Target Date 11/15/21                    Patient will benefit from skilled therapeutic intervention in order to improve the following deficits and impairments:     Visit Diagnosis: Open wound of right lower leg, sequela  Other abnormalities of gait and mobility     Problem List Patient Active Problem List   Diagnosis Date Noted   Pain due to onychomycosis of toenails of both feet 09/21/2021   Type 2 diabetes mellitus with vascular disease (HCC) 09/21/2021   Acquired hallux limitus of both feet 09/21/2021   Blood clotting disorder (HCC) 09/21/2021   PVD (peripheral vascular disease) (HCC) 09/21/2021   Acute diastolic HF (heart failure) (HCC)    PAH (pulmonary artery hypertension) (HCC)    Right-sided heart failure (HCC) 08/18/2021   Paroxysmal atrial fibrillation (HCC) 08/18/2021   Hypertension 08/18/2021   Pulmonary hypertension, unspecified (HCC) 08/18/2021  08/20/2021, PT CLT [phone]  10/14/2021, 1:52 PM   [facility] [address] [place], [place], [place] Phone: [phone]   Fax:  [phone]  Name: Leon Dennis MRN: [name] Date of Birth:  1945-08-07

## 2021-10-17 ENCOUNTER — Ambulatory Visit (HOSPITAL_COMMUNITY): Payer: PRIVATE HEALTH INSURANCE | Admitting: Physical Therapy

## 2021-10-19 ENCOUNTER — Ambulatory Visit (HOSPITAL_COMMUNITY): Payer: Medicare Other | Attending: Cardiology

## 2021-10-19 ENCOUNTER — Encounter (HOSPITAL_COMMUNITY): Payer: Self-pay

## 2021-10-19 DIAGNOSIS — R2689 Other abnormalities of gait and mobility: Secondary | ICD-10-CM | POA: Diagnosis present

## 2021-10-19 DIAGNOSIS — S81801S Unspecified open wound, right lower leg, sequela: Secondary | ICD-10-CM | POA: Insufficient documentation

## 2021-10-19 NOTE — Therapy (Signed)
[facility] [address] [place], [place], [address] Phone: [phone]   Fax:  [phone]  Wound Care Therapy  Patient Details  Name: Leon Dennis MRN: [id number] Date of Birth: Dec 26, 1945 Referring Provider (PT): Celso Amy MD   Encounter Date: 10/19/2021   PT End of Session - 10/19/21 1617     Visit Number 5    Number of Visits 12    Date for PT Re-Evaluation 11/15/21    Authorization Type Primary Medicare Secondary [employer] Sup    Progress Note Due on Visit 10    PT Start Time 1538    PT Stop Time 1605    PT Time Calculation (min) 27 min    Activity Tolerance Patient tolerated treatment well;Patient limited by pain   Limited by pain with debridment   Behavior During Therapy [facility] for tasks assessed/performed             Past Medical History:  Diagnosis Date   CHF (congestive heart failure) (HCC)    Diabetes mellitus without complication (HCC)    Hypertension    Kidney failure     Past Surgical History:  Procedure Laterality Date   CARDIOVERSION N/A 08/22/2021   Procedure: CARDIOVERSION;  Surgeon: Meriam Sprague, MD;  Location: [facility] ENDOSCOPY;  Service: Cardiovascular;  Laterality: N/A;    There were no vitals filed for this visit.    Subjective Assessment - 10/19/21 1805     Subjective Pt arrived wiht wife, reports he was out in rain earlier and wife changed dressings prior sessoin.  Arrived with Melburn Popper, stated his wife has applied twice since Monday.    Currently in Pain? Yes    Pain Score 3     Pain Location Ankle    Pain Orientation Right;Lateral    Pain Descriptors / Indicators Tender;Throbbing    Pain Type Acute pain                       Wound Therapy - 10/19/21 1616     Subjective Pt arrived wiht wife, reports he was out in rain earlier and wife changed dressings prior sessoin.  Arrived with Melburn Popper, stated his wife has applied twice since Monday.    Patient and Family  Stated Goals wound to heal    Date of Onset 09/03/21    Prior Treatments antibiotics, dressing changes, various ointments/medihoney    Pain Scale 0-10    Evaluation and Treatment Procedures Explained to Patient/Family Yes    Evaluation and Treatment Procedures agreed to    Wound Properties Date First Assessed: 10/04/21 Time First Assessed: 1400 Wound Type: Skin tear Location: Pretibial Location Orientation: Right;Lateral Wound Description (Comments): R lateral ankle wound- superior Present on Admission: Yes   Dressing Type Gauze (Comment);Santyl    Dressing Changed Changed    Dressing Status Old drainage    Dressing Change Frequency PRN    Site / Wound Assessment Black;Red;Painful    % Wound base Red or Granulating 20%    % Wound base Yellow/Fibrinous Exudate 80%    Peri-wound Assessment Erythema (blanchable);Other (Comment)    Drainage Amount Scant    Drainage Description Serous    Treatment Cleansed;Debridement (Selective)    Wound Properties Date First Assessed: 10/04/21 Time First Assessed: 1400 Wound Type: Skin tear Location: Pretibial Location Orientation: Right;Lateral Wound Description (Comments): R lateral ankle wound - Inferior   Wound Image Images linked: 1    Dressing Type Santyl;Gauze (Comment)  Dressing Changed Changed    Dressing Status Old drainage    Dressing Change Frequency PRN    Site / Wound Assessment Black;Yellow;Red    % Wound base Red or Granulating 10%    % Wound base Yellow/Fibrinous Exudate 20%    % Wound base Black/Eschar 70%    Peri-wound Assessment Erythema (blanchable)    Drainage Amount Scant    Drainage Description Serous    Treatment Cleansed;Debridement (Selective)    Selective Debridement - Location R ankle wound beds and periwound    Selective Debridement - Tools Used Forceps;Scalpel    Selective Debridement - Tissue Removed eschar, dry skin    Wound Therapy - Clinical Statement Pt limited by pain with debridement, limited ability to remove  black eschar.  Debridement perimeter of wound to loosen eschar as well as cross hatching on wound bed to assist with removal in future apts.  Changed dressing to Santyl, instructed wife to cleanse and changed daily wiht verbalized understanding.  Pt reports increased comfort wiht additional ABD pad.  Couple discussed arterial insuficency, wonder if ABI is needed.    Wound Therapy - Functional Problem List ambulation, sleep    Factors Delaying/Impairing Wound Healing Altered sensation;Diabetes Mellitus;Infection - systemic/local;Multiple medical problems;Vascular compromise    Hydrotherapy Plan Debridement;Dressing change;Electrical stimulation;Pulsatile lavage with suction;Ultrasonic wound therapy @35  KHz (+/- 3 KHz);Patient/family education    Wound Therapy - Frequency 2X / week    Wound Therapy - Current Recommendations PT    Wound Plan Clease, debride and dress wiht Santyl.  Wife to change daily.    Dressing  Santyl, 2x2, ABD pad and gauze with netting.                       PT Short Term Goals - 10/14/21 1352       PT SHORT TERM GOAL #1   Title Patient's wounds to be free from eschar.    Time 2    Period Weeks    Status On-going    Target Date 10/18/21               PT Long Term Goals - 10/14/21 1352       PT LONG TERM GOAL #1   Title Patient's wound to be healed    Time 6    Period Weeks    Status On-going    Target Date 11/15/21                    Patient will benefit from skilled therapeutic intervention in order to improve the following deficits and impairments:     Visit Diagnosis: Open wound of right lower leg, sequela  Other abnormalities of gait and mobility     Problem List Patient Active Problem List   Diagnosis Date Noted   Pain due to onychomycosis of toenails of both feet 09/21/2021   Type 2 diabetes mellitus with vascular disease (HCC) 09/21/2021   Acquired hallux limitus of both feet 09/21/2021   Blood clotting  disorder (HCC) 09/21/2021   PVD (peripheral vascular disease) (HCC) 09/21/2021   Acute diastolic HF (heart failure) (HCC)    PAH (pulmonary artery hypertension) (HCC)    Right-sided heart failure (HCC) 08/18/2021   Paroxysmal atrial fibrillation (HCC) 08/18/2021   Hypertension 08/18/2021   Pulmonary hypertension, unspecified (HCC) 08/18/2021   08/20/2021, LPTA/CLT; CBIS [phone]  [phone], PTA 10/19/2021, 6:36 PM  [place] 10/21/2021 Outpatient [facility] [address]  [place], [place], [address] Phone: [phone]   Fax:  [phone]  Name: Leon Dennis MRN: [id number] Date of Birth: 10-Oct-1945

## 2021-10-26 ENCOUNTER — Ambulatory Visit (HOSPITAL_COMMUNITY): Payer: Medicare Other | Attending: Internal Medicine | Admitting: Physical Therapy

## 2021-10-26 DIAGNOSIS — S81801S Unspecified open wound, right lower leg, sequela: Secondary | ICD-10-CM | POA: Insufficient documentation

## 2021-10-26 DIAGNOSIS — R2689 Other abnormalities of gait and mobility: Secondary | ICD-10-CM | POA: Insufficient documentation

## 2021-10-26 NOTE — Therapy (Signed)
[facility] [address] [place], [place], [address] Phone: [phone]   Fax:  [phone]  Wound Care Therapy  Patient Details  Name: Leon Dennis MRN: [id number] Date of Birth: [birth year] Referring Provider (PT): Celso Tristian Bouska MD   Encounter Date: 10/26/2021   PT End of Session - 10/26/21 1723     Visit Number 6    Number of Visits 12    Date for PT Re-Evaluation 11/15/21    Authorization Type Primary Medicare Secondary [employer] Sup    Progress Note Due on Visit 10    PT Start Time 1405    PT Stop Time 1440    PT Time Calculation (min) 35 min    Activity Tolerance Patient tolerated treatment well;Patient limited by pain   Limited by pain with debridment   Behavior During Therapy [facility] for tasks assessed/performed             Past Medical History:  Diagnosis Date   CHF (congestive heart failure) (HCC)    Diabetes mellitus without complication (HCC)    Hypertension    Kidney failure     Past Surgical History:  Procedure Laterality Date   CARDIOVERSION N/A 08/22/2021   Procedure: CARDIOVERSION;  Surgeon: Meriam Sprague, MD;  Location: [facility] ENDOSCOPY;  Service: Cardiovascular;  Laterality: N/A;    There were no vitals filed for this visit.               Wound Therapy - 10/26/21 1724     Subjective Wife reports she's been changing and reapply the santyl daily.  Also brough lidocane along with them to apply to perimeter and help with debridement pain.  pt reports continued discomfort at wound site.    Patient and Family Stated Goals wound to heal    Date of Onset 09/03/21    Prior Treatments antibiotics, dressing changes, various ointments/medihoney    Pain Scale 0-10    Pain Score 2     Pain Type Acute pain    Pain Location Ankle    Pain Orientation Right;Lateral    Pain Descriptors / Indicators Tender    Evaluation and Treatment Procedures Explained to Patient/Family Yes    Evaluation and  Treatment Procedures agreed to    Wound Properties Date First Assessed: 10/04/21 Time First Assessed: 1400 Wound Type: Skin tear Location: Pretibial Location Orientation: Right;Lateral Wound Description (Comments): R lateral ankle wound- superior Present on Admission: Yes   Dressing Type Gauze (Comment);Santyl    Dressing Changed Changed    Dressing Status Old drainage    Dressing Change Frequency PRN    Site / Wound Assessment Black;Red;Painful    % Wound base Red or Granulating 75%    % Wound base Yellow/Fibrinous Exudate 25%    % Wound base Black/Eschar 0%    Peri-wound Assessment Erythema (blanchable);Other (Comment)    Drainage Amount Scant    Drainage Description Serous    Treatment Cleansed;Debridement (Selective)    Wound Properties Date First Assessed: 10/04/21 Time First Assessed: 1400 Wound Type: Skin tear Location: Pretibial Location Orientation: Right;Lateral Wound Description (Comments): R lateral ankle wound - Inferior   Dressing Type Santyl;Gauze (Comment)    Dressing Changed Changed    Dressing Status Old drainage    Dressing Change Frequency PRN    Site / Wound Assessment Black;Yellow;Red    % Wound base Red or Granulating 10%    % Wound base Yellow/Fibrinous Exudate 80%    % Wound base Black/Eschar  10%    Peri-wound Assessment Erythema (blanchable)    Drainage Amount Minimal    Drainage Description Serosanguineous    Treatment Cleansed;Debridement (Selective)    Selective Debridement - Location larger lateral ankle wound    Selective Debridement - Tools Used Forceps;Scalpel    Selective Debridement - Tissue Removed eschar, dry skin    Wound Therapy - Clinical Statement Applied lidocane to perimeter of wound following removal of bandage; directions states takes 20-30 before active so unsure how much this helped, however pt did allow therapist to debride away majority of black eschar, approx 90% using peeling method wtih scapel.  mostly slough remains in wound bed at  this time.  Smaller wound superiorior to this one is almost healed at this point.  Cleased both well and continued with santyl to larger wound and xeroform to smaller one.  Pt reported overall comfort with bandage and minimal discomfort today wtih debridement.  Informed wife and pateint that order was written in epic by Dr Stacie Acres on 5/24 for a ABI (under "CV Proc" tab in Epic).  Wife will call radiology dept to schedule.    Wound Therapy - Functional Problem List ambulation, sleep    Factors Delaying/Impairing Wound Healing Altered sensation;Diabetes Mellitus;Infection - systemic/local;Multiple medical problems;Vascular compromise    Hydrotherapy Plan Debridement;Dressing change;Electrical stimulation;Pulsatile lavage with suction;Ultrasonic wound therapy @35  KHz (+/- 3 KHz);Patient/family education    Wound Therapy - Frequency 2X / week    Wound Therapy - Current Recommendations PT    Wound Plan Cleanse, debride and dress wiht Santyl.  Wife to change daily.    Dressing  larger lateral ankle wound:  Santyl, 2x2, ABD pad and gauze with netting.    Dressing Smaller wound: xeroform                       PT Short Term Goals - 10/14/21 1352       PT SHORT TERM GOAL #1   Title Patient's wounds to be free from eschar.    Time 2    Period Weeks    Status On-going    Target Date 10/18/21               PT Long Term Goals - 10/14/21 1352       PT LONG TERM GOAL #1   Title Patient's wound to be healed    Time 6    Period Weeks    Status On-going    Target Date 11/15/21                    Patient will benefit from skilled therapeutic intervention in order to improve the following deficits and impairments:     Visit Diagnosis: Open wound of right lower leg, sequela  Other abnormalities of gait and mobility     Problem List Patient Active Problem List   Diagnosis Date Noted   Pain due to onychomycosis of toenails of both feet 09/21/2021   Type 2 diabetes  mellitus with vascular disease (HCC) 09/21/2021   Acquired hallux limitus of both feet 09/21/2021   Blood clotting disorder (HCC) 09/21/2021   PVD (peripheral vascular disease) (HCC) 09/21/2021   Acute diastolic HF (heart failure) (HCC)    PAH (pulmonary artery hypertension) (HCC)    Right-sided heart failure (HCC) 08/18/2021   Paroxysmal atrial fibrillation (HCC) 08/18/2021   Hypertension 08/18/2021   Pulmonary hypertension, unspecified (HCC) 08/18/2021    08/20/2021 B, PTA 10/26/2021, 5:37 PM  [facility] [address] [place], [place], [address] Phone: [phone]   Fax:  [phone]  Name: Ronal Maybury MRN: [id number] Date of Birth: 1946/03/16

## 2021-10-28 ENCOUNTER — Ambulatory Visit (HOSPITAL_COMMUNITY): Payer: Medicare Other | Admitting: Physical Therapy

## 2021-10-28 DIAGNOSIS — S81801S Unspecified open wound, right lower leg, sequela: Secondary | ICD-10-CM

## 2021-10-28 DIAGNOSIS — R2689 Other abnormalities of gait and mobility: Secondary | ICD-10-CM

## 2021-10-28 NOTE — Therapy (Signed)
[facility] [address] [place], [place], [address] Phone: [phone]   Fax:  [phone]  Wound Care Therapy  Patient Details  Name: Leon Dennis MRN: [id number] Date of Birth: 1945/06/21 Referring Provider (PT): Celso Amy MD   Encounter Date: 10/28/2021   PT End of Session - 10/28/21 1525     Visit Number 7    Number of Visits 12    Date for PT Re-Evaluation 11/15/21    Authorization Type Primary Medicare Secondary [employer] Sup    Progress Note Due on Visit 10    PT Start Time 1450    PT Stop Time 1520    PT Time Calculation (min) 30 min    Activity Tolerance Patient tolerated treatment well;Patient limited by pain   Limited by pain with debridment   Behavior During Therapy [facility] for tasks assessed/performed             Past Medical History:  Diagnosis Date   CHF (congestive heart failure) (HCC)    Diabetes mellitus without complication (HCC)    Hypertension    Kidney failure     Past Surgical History:  Procedure Laterality Date   CARDIOVERSION N/A 08/22/2021   Procedure: CARDIOVERSION;  Surgeon: Meriam Sprague, MD;  Location: [facility] ENDOSCOPY;  Service: Cardiovascular;  Laterality: N/A;    There were no vitals filed for this visit.               Wound Therapy - 10/28/21 0001     Subjective Wife is dressing daily.  Pt states that he was rubbing his leg in his sleep and it is sore.    Patient and Family Stated Goals wound to heal    Date of Onset 09/03/21    Prior Treatments antibiotics, dressing changes, various ointments/medihoney    Pain Scale 0-10    Pain Score 4     Pain Type Acute pain    Pain Location Ankle    Pain Orientation Right;Lateral    Pain Descriptors / Indicators Sore    Evaluation and Treatment Procedures Explained to Patient/Family Yes    Evaluation and Treatment Procedures agreed to    Wound Properties Date First Assessed: 10/04/21 Time First Assessed: 1400  Wound Type: Skin tear Location: Pretibial Location Orientation: Right;Lateral Wound Description (Comments): R lateral ankle wound- superior Present on Admission: Yes   Dressing Type Gauze (Comment);Santyl    Dressing Changed Changed    Dressing Status Old drainage    Dressing Change Frequency PRN    Site / Wound Assessment Red;Painful    % Wound base Red or Granulating 70%    % Wound base Yellow/Fibrinous Exudate 40%    % Wound base Black/Eschar 0%    Peri-wound Assessment Erythema (blanchable);Other (Comment)    Drainage Amount Scant    Drainage Description Serous    Treatment Cleansed;Debridement (Selective)    Wound Properties Date First Assessed: 10/04/21 Time First Assessed: 1400 Wound Type: Skin tear Location: Pretibial Location Orientation: Right;Lateral Wound Description (Comments): R lateral ankle wound - Inferior   Dressing Type Santyl;Gauze (Comment)    Dressing Changed Changed    Dressing Status Old drainage    Dressing Change Frequency PRN    Site / Wound Assessment Black;Yellow;Red    % Wound base Red or Granulating 20%    % Wound base Yellow/Fibrinous Exudate 70%    % Wound base Black/Eschar 10%    Peri-wound Assessment Erythema (blanchable)    Drainage Amount Minimal  Drainage Description Serous    Treatment Cleansed;Debridement (Selective)    Selective Debridement - Location larger lateral ankle wound    Selective Debridement - Tools Used Forceps;Scalpel    Selective Debridement - Tissue Removed eschar and slough    Wound Therapy - Clinical Statement Applied lidocane to perimeter of wound following removal of bandage; directions states takes 20-30 before active so unsure how much this helped, suggested to have pt put it on prior to leaving their home.  Pt wife is changing dressing daily to apply santyl.  Therapist stated that it would be fine for pt to decrease to once a week due to wife caring for wound at home.  Pt and wife agree. PT continues to be tender to  debridement which is our limiting factor.  PT is to schedule his ABI next week.  PT will continue to benefit from weekly sharp debridement of wound.    Wound Therapy - Functional Problem List ambulation, sleep    Factors Delaying/Impairing Wound Healing Altered sensation;Diabetes Mellitus;Infection - systemic/local;Multiple medical problems;Vascular compromise    Hydrotherapy Plan Debridement;Dressing change;Electrical stimulation;Pulsatile lavage with suction;Ultrasonic wound therapy @35  KHz (+/- 3 KHz);Patient/family education    Wound Therapy - Frequency 2X / week   decrease to once a week   Wound Therapy - Current Recommendations PT    Wound Plan Cleanse, debride and dress wiht Santyl.  Wife to change daily.    Dressing  larger lateral ankle wound:  Santyl, 2x2, ABD pad and gauze with netting.    Dressing Smaller wound: xeroform                       PT Short Term Goals - 10/28/21 1534       PT SHORT TERM GOAL #1   Title Patient's wounds to be free from eschar.    Time 2    Period Weeks    Status On-going    Target Date 10/18/21               PT Long Term Goals - 10/28/21 1534       PT LONG TERM GOAL #1   Title Patient's wound to be healed    Time 6    Period Weeks    Status On-going    Target Date 11/15/21                   Plan - 10/28/21 1534     Clinical Impression Statement see above    Personal Factors and Comorbidities Comorbidity 3+;Time since onset of injury/illness/exacerbation    Comorbidities neuropathy, DM, CKD, CHF, anemia, afib, blood clotting disorder, PVD, pulmonary HTN    Examination-Activity Limitations Transfers;Stairs;Squat;Locomotion Level;Hygiene/Grooming    Examination-Participation Restrictions Community Activity;Yard Work    10/30/21 Evolving/Moderate complexity    Rehab Potential Good    PT Frequency 1x / week    PT Duration 6 weeks    PT Treatment/Interventions ADLs/Self Care Home  Management;Gait training;DME Instruction;Stair training;Functional mobility training;Therapeutic activities;Therapeutic exercise;Balance training;Neuromuscular re-education;Patient/family education;Manual techniques;Manual lymph drainage;Compression bandaging;Scar mobilization;Passive range of motion;Dry needling;Taping;Splinting;Orthotic Fit/Training    PT Next Visit Plan debride and dressing changes    Consulted and Agree with Plan of Care Patient;Family member/caregiver    Family Member Consulted Wife             Patient will benefit from skilled therapeutic intervention in order to improve the following deficits and impairments:  Decreased skin integrity, Pain, Decreased mobility  Visit Diagnosis:  Open wound of right lower leg, sequela  Other abnormalities of gait and mobility     Problem List Patient Active Problem List   Diagnosis Date Noted   Pain due to onychomycosis of toenails of both feet 09/21/2021   Type 2 diabetes mellitus with vascular disease (HCC) 09/21/2021   Acquired hallux limitus of both feet 09/21/2021   Blood clotting disorder (HCC) 09/21/2021   PVD (peripheral vascular disease) (HCC) 09/21/2021   Acute diastolic HF (heart failure) (HCC)    PAH (pulmonary artery hypertension) (HCC)    Right-sided heart failure (HCC) 08/18/2021   Paroxysmal atrial fibrillation (HCC) 08/18/2021   Hypertension 08/18/2021   Pulmonary hypertension, unspecified (HCC) 08/18/2021   Virgina Organ, PT CLT [phone]  10/28/2021, 3:35 PM  [place] [facility] [address] [place], [place], [address] Phone: [phone]   Fax:  [phone]  Name: Leon Dennis MRN: [id number] Date of Birth: [birth year]

## 2021-11-02 ENCOUNTER — Other Ambulatory Visit: Payer: Self-pay | Admitting: Podiatry

## 2021-11-02 ENCOUNTER — Ambulatory Visit (HOSPITAL_COMMUNITY): Payer: PRIVATE HEALTH INSURANCE | Admitting: Physical Therapy

## 2021-11-02 DIAGNOSIS — I739 Peripheral vascular disease, unspecified: Secondary | ICD-10-CM

## 2021-11-04 ENCOUNTER — Ambulatory Visit (HOSPITAL_COMMUNITY): Payer: Medicare Other | Attending: Internal Medicine | Admitting: Physical Therapy

## 2021-11-04 DIAGNOSIS — S81801S Unspecified open wound, right lower leg, sequela: Secondary | ICD-10-CM | POA: Insufficient documentation

## 2021-11-04 DIAGNOSIS — R2689 Other abnormalities of gait and mobility: Secondary | ICD-10-CM | POA: Diagnosis present

## 2021-11-04 NOTE — Therapy (Signed)
[place] [facility] [address] [place], [place], [address] Phone: [phone]   Fax:  [phone]  Wound Care Therapy  Patient Details  Name: Leon Dennis MRN: [id number] Date of Birth: Jun 03, 1945 Referring Provider (PT): Celso Melisssa Donner MD   Encounter Date: 11/04/2021   PT End of Session - 11/04/21 1240     Visit Number 8    Number of Visits 12    Date for PT Re-Evaluation 11/15/21    Authorization Type Primary Medicare Secondary [employer] MCR Sup    Progress Note Due on Visit 10    PT Start Time 1145    PT Stop Time 1220    PT Time Calculation (min) 35 min    Activity Tolerance Patient tolerated treatment well;Patient limited by pain   Limited by pain with debridment   Behavior During Therapy [facility] for tasks assessed/performed             Past Medical History:  Diagnosis Date   CHF (congestive heart failure) (HCC)    Diabetes mellitus without complication (HCC)    Hypertension    Kidney failure     Past Surgical History:  Procedure Laterality Date   CARDIOVERSION N/A 08/22/2021   Procedure: CARDIOVERSION;  Surgeon: Meriam Sprague, MD;  Location: [facility] ENDOSCOPY;  Service: Cardiovascular;  Laterality: N/A;    There were no vitals filed for this visit.               Wound Therapy - 11/04/21 1240     Subjective pt states some soreness but has put extra padding over wound to help with this.  Wife continues to change daily and dress with santyl.    Patient and Family Stated Goals wound to heal    Date of Onset 09/03/21    Prior Treatments antibiotics, dressing changes, various ointments/medihoney    Pain Scale 0-10    Pain Score 4     Pain Type Acute pain    Pain Location Ankle    Pain Orientation Right;Lateral    Pain Descriptors / Indicators Sore    Evaluation and Treatment Procedures Explained to Patient/Family Yes    Evaluation and Treatment Procedures agreed to    Wound Properties Date First  Assessed: 10/04/21 Time First Assessed: 1400 Wound Type: Skin tear Location: Pretibial Location Orientation: Right;Lateral Wound Description (Comments): R lateral ankle wound- superior Present on Admission: Yes Final Assessment Date: 11/04/21 Final Assessment Time: 1155   Wound Properties Date First Assessed: 10/04/21 Time First Assessed: 1400 Wound Type: Skin tear Location: Pretibial Location Orientation: Right;Lateral Wound Description (Comments): R lateral ankle wound - Inferior   Dressing Type Santyl;Gauze (Comment)    Dressing Changed Changed    Dressing Status Old drainage    Dressing Change Frequency PRN    Site / Wound Assessment Black;Yellow;Red    % Wound base Red or Granulating 20%    % Wound base Yellow/Fibrinous Exudate 70%    % Wound base Black/Eschar 10%    Peri-wound Assessment Erythema (blanchable)    Wound Length (cm) 2.8 cm   was 3   Wound Width (cm) 1.2 cm   was 1.2   Wound Surface Area (cm^2) 3.36 cm^2    Drainage Amount Minimal    Drainage Description Serous    Treatment Cleansed;Debridement (Selective)    Selective Debridement - Location larger lateral ankle wound    Selective Debridement - Tools Used Forceps;Scalpel    Selective Debridement - Tissue Removed eschar and slough  Wound Therapy - Clinical Statement Wound cleansed and debrided to remove more eschar. Measured with noted approximation of length of wound. Pt tolerating debridement much better as well.  Most superior area is now healed completely.  Pt scheduled to have ABI next Tueseday.  Wound is responding well to santyl.  Extra ABD added to help pad area as is sore and over body prominence.    Wound Therapy - Functional Problem List ambulation, sleep    Factors Delaying/Impairing Wound Healing Altered sensation;Diabetes Mellitus;Infection - systemic/local;Multiple medical problems;Vascular compromise    Hydrotherapy Plan Debridement;Dressing change;Electrical stimulation;Pulsatile lavage with  suction;Ultrasonic wound therapy @35  KHz (+/- 3 KHz);Patient/family education    Wound Therapy - Frequency 2X / week   decrease to once a week   Wound Therapy - Current Recommendations PT    Wound Plan Cleanse, debride and dress wiht Santyl.  Wife to change daily.    Dressing  Santyl, 2x2, ABD pad and gauze with netting.                       PT Short Term Goals - 10/28/21 1534       PT SHORT TERM GOAL #1   Title Patient's wounds to be free from eschar.    Time 2    Period Weeks    Status On-going    Target Date 10/18/21               PT Long Term Goals - 10/28/21 1534       PT LONG TERM GOAL #1   Title Patient's wound to be healed    Time 6    Period Weeks    Status On-going    Target Date 11/15/21                    Patient will benefit from skilled therapeutic intervention in order to improve the following deficits and impairments:     Visit Diagnosis: Open wound of right lower leg, sequela  Other abnormalities of gait and mobility     Problem List Patient Active Problem List   Diagnosis Date Noted   Pain due to onychomycosis of toenails of both feet 09/21/2021   Type 2 diabetes mellitus with vascular disease (HCC) 09/21/2021   Acquired hallux limitus of both feet 09/21/2021   Blood clotting disorder (HCC) 09/21/2021   PVD (peripheral vascular disease) (HCC) 09/21/2021   Acute diastolic HF (heart failure) (HCC)    PAH (pulmonary artery hypertension) (HCC)    Right-sided heart failure (HCC) 08/18/2021   Paroxysmal atrial fibrillation (HCC) 08/18/2021   Hypertension 08/18/2021   Pulmonary hypertension, unspecified (HCC) 08/18/2021   08/20/2021, PTA/CLT [facility] Ph: [phone]  [phone] 11/04/2021, 1:02 PM  [place] [facility] [address] [place], [place], [place] Phone: [phone]   Fax:  [phone]  Name: Leon Dennis MRN: [name] Date of Birth: 1945-05-25

## 2021-11-08 ENCOUNTER — Ambulatory Visit (INDEPENDENT_AMBULATORY_CARE_PROVIDER_SITE_OTHER): Payer: Medicare Other

## 2021-11-08 ENCOUNTER — Ambulatory Visit (HOSPITAL_COMMUNITY): Payer: Medicare Other

## 2021-11-08 ENCOUNTER — Encounter (HOSPITAL_COMMUNITY): Payer: Self-pay

## 2021-11-08 DIAGNOSIS — I739 Peripheral vascular disease, unspecified: Secondary | ICD-10-CM

## 2021-11-08 DIAGNOSIS — R2689 Other abnormalities of gait and mobility: Secondary | ICD-10-CM

## 2021-11-08 DIAGNOSIS — S81801S Unspecified open wound, right lower leg, sequela: Secondary | ICD-10-CM | POA: Diagnosis not present

## 2021-11-08 NOTE — Therapy (Signed)
[place] [facility] [address] [place], [place], [address] Phone: [phone]   Fax:  [phone]  Wound Care Therapy  Patient Details  Name: Leon Dennis MRN: [id number] Date of Birth: [birth year] Referring Provider (PT): Celso Amy MD   Encounter Date: 11/08/2021   PT End of Session - 11/08/21 1152     Visit Number 9    Number of Visits 12    Date for PT Re-Evaluation 11/15/21    Authorization Type Primary Medicare Secondary [employer] MCR Sup    Progress Note Due on Visit 10    PT Start Time 1111    PT Stop Time 1138    PT Time Calculation (min) 27 min    Activity Tolerance Patient tolerated treatment well;Patient limited by pain   Limited by pain with debridement   Behavior During Therapy [facility] for tasks assessed/performed             Past Medical History:  Diagnosis Date   CHF (congestive heart failure) (HCC)    Diabetes mellitus without complication (HCC)    Hypertension    Kidney failure     Past Surgical History:  Procedure Laterality Date   CARDIOVERSION N/A 08/22/2021   Procedure: CARDIOVERSION;  Surgeon: Meriam Sprague, MD;  Location[facility] ENDOSCOPY;  Service: Cardiovascular;  Laterality: N/A;    There were no vitals filed for this visit.    Subjective Assessment - 11/08/21 1143     Subjective Pt stated wife has been doing dressing change daily.  Forgot Santyl this treatment.  Stated main pain when laying in bed with pressure on wound, has used maxi-pad and other liners for support to assist with pain.    Currently in Pain? Yes    Pain Score 4     Pain Location Ankle    Pain Orientation Right;Lateral    Pain Descriptors / Indicators Sore                       Wound Therapy - 11/08/21 1143     Subjective Pt stated wife has been doing dressing change daily.  Forgot Santyl this treatment.  Stated main pain when laying in bed with pressure on wound, has used maxi-pad and other liners for  support to assist with pain.    Patient and Family Stated Goals wound to heal    Date of Onset 09/03/21    Prior Treatments antibiotics, dressing changes, various ointments/medihoney    Pain Scale 0-10    Evaluation and Treatment Procedures Explained to Patient/Family Yes    Evaluation and Treatment Procedures agreed to    Wound Properties Date First Assessed: 10/04/21 Time First Assessed: 1400 Wound Type: Skin tear Location: Pretibial Location Orientation: Right;Lateral Wound Description (Comments): R lateral ankle wound - Inferior   Wound Image Images linked: 1    Dressing Type Gauze (Comment);Honey;Abdominal pads   Medihoney, ABD pad, gauze   Dressing Changed Changed    Dressing Status Old drainage    Dressing Change Frequency PRN    Site / Wound Assessment Yellow;Red    % Wound base Red or Granulating 40%    % Wound base Yellow/Fibrinous Exudate 60%    % Wound base Black/Eschar 0%    Peri-wound Assessment Erythema (blanchable)    Drainage Amount Minimal    Drainage Description Serous    Treatment Cleansed;Debridement (Selective)    Selective Debridement - Location larger lateral ankle wound    Selective Debridement - Tools  Used Forceps;Scalpel    Selective Debridement - Tissue Removed eschar and slough    Wound Therapy - Clinical Statement Wound cleansed and selective debridment for removal of eschar from wound bed.  Pt limited by pain during debridement.  Pt forgot Santyl this session, used medihoney to assist with slough.  ABD pad used for support due to wound on bony prominence for pain control.  Noted irritation on skin perimeter of wound especially superior, traced to see if irritation grows.  Pt with ABI apt later today.    Wound Therapy - Functional Problem List ambulation, sleep    Factors Delaying/Impairing Wound Healing Altered sensation;Diabetes Mellitus;Infection - systemic/local;Multiple medical problems;Vascular compromise    Hydrotherapy Plan Debridement;Dressing  change;Electrical stimulation;Pulsatile lavage with suction;Ultrasonic wound therapy @35  KHz (+/- 3 KHz);Patient/family education    Wound Therapy - Frequency 2X / week   1x/week   Wound Therapy - Current Recommendations PT    Wound Plan Cleanse, debride and dress wiht Santyl.  Wife to change daily.    Dressing  Medihoney, 2x2, ABD pad, gauze wiht netting                       PT Short Term Goals - 10/28/21 1534       PT SHORT TERM GOAL #1   Title Patient's wounds to be free from eschar.    Time 2    Period Weeks    Status On-going    Target Date 10/18/21               PT Long Term Goals - 10/28/21 1534       PT LONG TERM GOAL #1   Title Patient's wound to be healed    Time 6    Period Weeks    Status On-going    Target Date 11/15/21                    Patient will benefit from skilled therapeutic intervention in order to improve the following deficits and impairments:     Visit Diagnosis: Open wound of right lower leg, sequela  Other abnormalities of gait and mobility     Problem List Patient Active Problem List   Diagnosis Date Noted   Pain due to onychomycosis of toenails of both feet 09/21/2021   Type 2 diabetes mellitus with vascular disease (HCC) 09/21/2021   Acquired hallux limitus of both feet 09/21/2021   Blood clotting disorder (HCC) 09/21/2021   PVD (peripheral vascular disease) (HCC) 09/21/2021   Acute diastolic HF (heart failure) (HCC)    PAH (pulmonary artery hypertension) (HCC)    Right-sided heart failure (HCC) 08/18/2021   Paroxysmal atrial fibrillation (HCC) 08/18/2021   Hypertension 08/18/2021   Pulmonary hypertension, unspecified (HCC) 08/18/2021   08/20/2021, LPTA/CLT; CBIS [phone]  [phone], PTA 11/08/2021, 11:53 AM  [facility] [address] [place], [place], [place] Phone: [phone]   Fax:  [phone]  Name: Leon Dennis MRN:  [name] Date of Birth: April 02, 1946

## 2021-11-09 ENCOUNTER — Other Ambulatory Visit: Payer: Self-pay | Admitting: Podiatry

## 2021-11-09 ENCOUNTER — Ambulatory Visit: Payer: Medicare Other | Admitting: Cardiology

## 2021-11-09 DIAGNOSIS — I739 Peripheral vascular disease, unspecified: Secondary | ICD-10-CM

## 2021-11-16 ENCOUNTER — Ambulatory Visit (HOSPITAL_COMMUNITY): Payer: Medicare Other | Admitting: Physical Therapy

## 2021-11-16 DIAGNOSIS — R2689 Other abnormalities of gait and mobility: Secondary | ICD-10-CM

## 2021-11-16 DIAGNOSIS — S81801S Unspecified open wound, right lower leg, sequela: Secondary | ICD-10-CM

## 2021-11-16 NOTE — Therapy (Signed)
[place], [place], [address] Phone: [phone]   Fax:  [phone]  Wound Care Therapy  Patient Details  Name: Leon Dennis MRN: [id number] Date of Birth: May 10, 1945 Referring Provider (PT): Ulice Bold MD Progress Note Reporting Period 10/04/2021 to 11/16/2021  See note below for Objective Data and Assessment of Progress/Goals.      Encounter Date: 11/16/2021   PT End of Session - 11/16/21 1417     Visit Number 10    Number of Visits 18   Date for PT Re-Evaluation 01/05/2022   Authorization Type Primary Medicare Secondary [employer] MCR Sup    Progress Note Due on Visit 18    PT Start Time 1348    PT Stop Time 1412    PT Time Calculation (min) 24 min    Activity Tolerance Patient tolerated treatment well;Patient limited by pain   Limited by pain with debridement   Behavior During Therapy [facility] for tasks assessed/performed             Past Medical History:  Diagnosis Date   CHF (congestive heart failure) ([place])    Diabetes mellitus without complication ([place])    Hypertension    Kidney failure     Past Surgical History:  Procedure Laterality Date   CARDIOVERSION N/A 08/22/2021   Procedure: CARDIOVERSION;  Surgeon: Freada Bergeron, MD;  Location: [facility] ENDOSCOPY;  Service: Cardiovascular;  Laterality: N/A;    There were no vitals filed for this visit.               Wound Therapy - 11/16/21 0001     Subjective PT states he is able to drive, walk and stand now without pain.    Patient and Family Stated Goals wound to heal    Date of Onset 09/03/21    Prior Treatments antibiotics, dressing changes, various ointments/medihoney    Pain Scale 0-10    Pain Score 1     Pain Location Ankle    Pain Orientation Right;Lateral    Pain Descriptors / Indicators Sore    Evaluation and Treatment Procedures Explained to Patient/Family Yes    Evaluation and Treatment Procedures agreed to     Wound Properties Date First Assessed: 10/04/21 Time First Assessed: 1400 Wound Type: Skin tear Location: Pretibial Location Orientation: Right;Lateral Wound Description (Comments): R lateral ankle wound - Inferior   Wound Image Images linked: 1    Dressing Type Gauze (Comment);Honey;Abdominal pads   Medihoney, ABD pad, gauze   Dressing Changed Changed    Dressing Status Old drainage    Dressing Change Frequency PRN    Site / Wound Assessment Yellow;Red    % Wound base Red or Granulating 60%   initally  5 % granulated   % Wound base Yellow/Fibrinous Exudate 40%   initally 5% yellow   % Wound base Black/Eschar --   initally 90% black   Peri-wound Assessment Erythema (blanchable)    Wound Length (cm) 4 cm   initally there were two wounds that have now met.  First was 1.2 in length with the second 3.2 total of 4.4   Wound Width (cm) 2.5 cm   initially 2 wounds the firs. 1.1 width second 2.5.   Wound Depth (cm) 0.2 cm    Wound Volume (cm^3) 2 cm^3    Wound Surface Area (cm^2) 10 cm^2    Drainage Amount Minimal    Drainage Description Green    Treatment Cleansed;Debridement (Selective)  Selective Debridement - Location lateral ankle wound    Selective Debridement - Tools Used Forceps;Scalpel    Selective Debridement - Tissue Removed slough.    Wound Therapy - Clinical Statement PT wound currently has slight green tinge to drainage and increased redness around the wound.  Pt had stopped taking his antibiotics secondary to stomack upset but started taking them again yesterday. Wound surface area is increased from evaluatrion from 9.32 to 10.  Wound has improved granulation.  Pt has an appointment next week with vasucalar to see if there is an arterial deficit.  Pt will continue to benefit from skilled PT once a week fro the next 8 weeks to ensure wound is healing and adjust dressing as needed if it is not.    Wound Therapy - Functional Problem List ability to bath    Factors Delaying/Impairing  Wound Healing Altered sensation;Diabetes Mellitus;Infection - systemic/local;Multiple medical problems;Vascular compromise    Hydrotherapy Plan Debridement;Dressing change;Electrical stimulation;Pulsatile lavage with suction;Ultrasonic wound therapy _0  KHz (+/- 3 KHz);Patient/family education    Wound Therapy - Frequency Other (comment)   1x/week for the next 8 weeks.   Wound Therapy - Current Recommendations PT    Wound Plan Cleanse, debride and dress wiht Santyl.  Wife to change daily.    Dressing  santyl, 4x4, ab pad and kerlix.  Netting to secure.                       PT Short Term Goals - 11/16/21 1429       PT SHORT TERM GOAL #1   Title Patient's wounds to be free from eschar.    Time 2    Period Weeks    Status Achieved    Target Date 10/18/21               PT Long Term Goals - 11/16/21 1429       PT LONG TERM GOAL #1   Title Patient's wound to be healed    Time 6    Period Weeks    Status On-going    Target Date 01/11/22                   Plan - 11/16/21 1430     Clinical Impression Statement see above    Personal Factors and Comorbidities Comorbidity 3+;Time since onset of injury/illness/exacerbation    Comorbidities neuropathy, DM, CKD, CHF, anemia, afib, blood clotting disorder, PVD, pulmonary HTN    Examination-Activity Limitations Transfers;Stairs;Squat;Locomotion Level;Hygiene/Grooming    Examination-Participation Restrictions Community Activity;Yard Work    [employer] Evolving/Moderate complexity    Rehab Potential Good    PT Frequency 1x / week    PT Duration 6 weeks    PT Treatment/Interventions ADLs/Self Care Home Management;Gait training;DME Instruction;Stair training;Functional mobility training;Therapeutic activities;Therapeutic exercise;Balance training;Neuromuscular re-education;Patient/family education;Manual techniques;Manual lymph drainage;Compression bandaging;Scar mobilization;Passive range of  motion;Dry needling;Taping;Splinting;Orthotic Fit/Training    PT Next Visit Plan debride and dressing changes    Consulted and Agree with Plan of Care Patient;Family member/caregiver    Family Member Consulted Wife             Patient will benefit from skilled therapeutic intervention in order to improve the following deficits and impairments:  Decreased skin integrity, Pain, Decreased mobility  Visit Diagnosis: Open wound of right lower leg, sequela  Other abnormalities of gait and mobility     Problem List Patient Active Problem List   Diagnosis Date Noted   Pain due to  onychomycosis of toenails of both feet 09/21/2021   Type 2 diabetes mellitus with vascular disease ([place]) 09/21/2021   Acquired hallux limitus of both feet 09/21/2021   Blood clotting disorder ([place]) 09/21/2021   PVD (peripheral vascular disease) ([employer]) 35/46/5681   Acute diastolic HF (heart failure) (HCC)    PAH (pulmonary artery hypertension) ([place])    Right-sided heart failure ([place]) 08/18/2021   Paroxysmal atrial fibrillation ([place]) 08/18/2021   Hypertension 08/18/2021   Pulmonary hypertension, unspecified [facility]) 08/18/2021   Rayetta Humphrey, PT CLT [phone]  11/16/2021, 2:30 PM  [place] [address] [place], [place], [address] Phone: [phone]   Fax:  [phone]  Name: Leon Dennis MRN: [id number] Date of Birth: 24-Oct-1945

## 2021-11-20 NOTE — Progress Notes (Unsigned)
[facility] [place]  ASSESSMENT / PLAN: Leon Dennis is a 76 y.o. male with right lateral heel ulcer just above the lateral malleolus.  His noninvasive vascular testing is reassuring; his toe pressures predict healing without intervention levels.  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  I wrapped him in an Radio broadcast assistant today.  I encouraged him to follow-up with either the wound care center or establish home health care for ongoing wound changes.  His wife is a retired Engineer, civil (consulting) and is comfortable helping him with local wound care.  I will see him again in a month to evaluate the wound.  Should he suffer deterioration, we will pursue angiography.  CHIEF COMPLAINT: Right ankle ulcer  HISTORY OF PRESENT ILLNESS: Leon Dennis is a 76 y.o. male referred to clinic for evaluation of right ankle ulcer.  He had noninvasive testing performed at an outside facility prior to my evaluation.  He reports the ulcer developed after scraping his ankle on his toolbox.  This is progressively worsened over the course of many weeks.  He has been under the care of wound care center, who is concerned he had arterial ulceration, prompting referral for noninvasive testing.  Patient reports no antecedent symptoms of claudication, ischemic rest pain, or prior ulceration.  He did have significant swelling and surrounding cellulitis about the ulcer, which has improved with local care and antibiotics..  Past Medical History:  Diagnosis Date   CHF (congestive heart failure) (HCC)    Diabetes mellitus without complication (HCC)    Hypertension    Kidney failure     Past Surgical History:  Procedure Laterality Date   CARDIOVERSION N/A 08/22/2021   Procedure: CARDIOVERSION;  Surgeon: Meriam Sprague, MD;  Location: [facility] ENDOSCOPY;  Service: Cardiovascular;  Laterality: N/A;    History reviewed. No pertinent family history.  Social History   Socioeconomic History   Marital status:  Married    Spouse name: Not on file   Number of children: Not on file   Years of education: Not on file   Highest education level: Not on file  Occupational History   Not on file  Tobacco Use   Smoking status: Never   Smokeless tobacco: Never  Substance and Sexual Activity   Alcohol use: Yes    Comment: [email or web address]   Drug use: Never   Sexual activity: Not on file  Other Topics Concern   Not on file  Social History Narrative   Not on file   Social Determinants of Health   Financial Resource Strain: Not on file  Food Insecurity: Not on file  Transportation Needs: Not on file  Physical Activity: Not on file  Stress: Not on file  Social Connections: Not on file  Intimate Partner Violence: Not on file    No Known Allergies  Current Outpatient Medications  Medication Sig Dispense Refill   acetaminophen (TYLENOL) 500 MG tablet Take 1,000 mg by mouth every 4 (four) hours as needed for mild pain.     amLODipine (NORVASC) 10 MG tablet Take 10 mg by mouth daily.     apixaban (ELIQUIS) 5 MG TABS tablet Take 5 mg by mouth 2 (two) times daily.     busPIRone (BUSPAR) 5 MG tablet Take 5 mg by mouth 3 (three) times daily.     dapagliflozin propanediol (FARXIGA) 5 MG TABS tablet Take 5 mg by mouth daily.     glimepiride (AMARYL) 4 MG tablet Take 4 mg by mouth in the morning  and at bedtime.     hydrALAZINE (APRESOLINE) 25 MG tablet Take 1 tablet (25 mg total) by mouth every 8 (eight) hours. 90 tablet 6   lisinopril (ZESTRIL) 40 MG tablet Take 40 mg by mouth daily.     magnesium chloride (SLOW-MAG) 64 MG TBEC SR tablet Take 64 mg by mouth daily.     melatonin 5 MG TABS Take 5 mg by mouth at bedtime as needed (sleep).     metoprolol tartrate (LOPRESSOR) 100 MG tablet Take 100 mg by mouth 2 (two) times daily.     Multiple Vitamin (MULTIVITAMIN) tablet Take 1 tablet by mouth daily.     pioglitazone (ACTOS) 30 MG tablet Take 30 mg by mouth daily.     potassium chloride SA (KLOR-CON M) 20 MEQ  tablet Take 2 tablets (40 mEq total) by mouth daily. 30 tablet 6   sodium chloride (OCEAN) 0.65 % SOLN nasal spray Place 1 spray into both nostrils as needed for congestion.     torsemide (DEMADEX) 20 MG tablet Take 1 tablet (20 mg total) by mouth daily. And as needed per insturctions 50 tablet 6   triamcinolone (NASACORT) 55 MCG/ACT AERO nasal inhaler Place 2 sprays into the nose daily as needed (allergies).     VITAMIN D PO Take 1 tablet by mouth daily.     No current facility-administered medications for this visit.    PHYSICAL EXAM Vitals:   11/22/21 1037  BP: (!) 152/93  Pulse: 94  Resp: 20  Temp: 98.2 F (36.8 C)  SpO2: 96%  Weight: 181 lb (82.1 kg)  Height: 5\' 8"  (1.727 m)   Well-appearing elderly man in no acute distress Regular rate and rhythm Unlabored breathing I am not able to appreciate any palpable pedal pulses He is less than 2-second capillary refill bilaterally He has a broad-based ulcer with fibrinous exudate at the base in the skin immediately above the lateral malleolus.  There is some mild surrounding erythema.  PERTINENT LABORATORY AND RADIOLOGIC DATA  Most recent CBC    Latest Ref Rng & Units 08/22/2021   11:07 AM 08/18/2021    2:54 PM  CBC  WBC 4.0 - 10.5 K/uL 4.9  5.5   Hemoglobin 13.0 - 17.0 g/dL 08/20/2021  81.0   Hematocrit 39.0 - 52.0 % 43.8  37.3   Platelets 150 - 400 K/uL 204  183      Most recent CMP    Latest Ref Rng & Units 08/29/2021    9:49 AM 08/22/2021   11:07 AM 08/21/2021    3:20 AM  CMP  Glucose 70 - 99 mg/dL 08/23/2021  102  585   BUN 8 - 27 mg/dL 22  21  22    Creatinine 0.76 - 1.27 mg/dL 277   8.24   Sodium 134 - 144 mmol/L 137  140  136   Potassium 3.5 - 5.2 mmol/L 4.0  3.2  3.5   Chloride 96 - 106 mmol/L 100  101  103   CO2 20 - 29 mmol/L 24  28  25    Calcium 8.6 - 10.2 mg/dL 9.4  9.5  9.2   Total Protein 6.0 - 8.5 g/dL 7.0   6.9   Total Bilirubin 0.0 - 1.2 mg/dL 0.6   1.3   Alkaline Phos 44 - 121 IU/L 82   60   AST 0 - 40  IU/L 23   21   ALT 0 - 44 IU/L 13   11  Renal function CrCl cannot be calculated (Patient's most recent lab result is older than the maximum 21 days allowed.).  Hgb A1c MFr Bld (%)  Date Value  08/18/2021 7.3 (H)    LDL Cholesterol  Date Value Ref Range Status  08/19/2021 52 0 - 99 mg/dL Final    Comment:           Total Cholesterol/HDL:CHD Risk Coronary Heart Disease Risk Table                     Men   Women  1/2 Average Risk   3.4   3.3  Average Risk       5.0   4.4  2 X Average Risk   9.6   7.1  3 X Average Risk  23.4   11.0        Use the calculated Patient Ratio above and the CHD Risk Table to determine the patient's CHD Risk.        ATP III CLASSIFICATION (LDL):  <100     mg/dL   Optimal  664-403  mg/dL   Near or Above                    Optimal  130-159  mg/dL   Borderline  474-259  mg/dL   High  >563     mg/dL   Very High Performed at [facility], [address] [place], [place] [address]     Arterial ultrasound A focal velocity elevation of 278 cm/s was obtained at dist popliteal  artery with a VR of 2.1. 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 A  2nd focal velocity elevation was visualized, measuring 397 cm/s at prox  peroneal artery with a VR of 5.75.  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 50-74% stenosis noted  in the peroneal artery.   +-------+-----------+-----------+------------+------------+  ABI/TBIToday's ABIToday's TBIPrevious ABIPrevious TBI  +-------+-----------+-----------+------------+------------+  Right  0.92       0.56                                 +-------+-----------+-----------+------------+------------+  Left   1.85       0.68                                 +-------+-----------+-----------+------------+------------+    Rande Brunt. Lenell Antu,  MD [facility] Phone Number: [phone] 11/22/2021 12:26 PM  Total time spent on preparing this encounter including chart review, data review, collecting history, examining the patient, coordinating care for this new patient, 60 minutes.  Portions of this report may have been transcribed using voice recognition software.  Every effort has been made to ensure accuracy; however, inadvertent computerized transcription errors may still be present.

## 2021-11-22 ENCOUNTER — Encounter: Payer: Self-pay | Admitting: Vascular Surgery

## 2021-11-22 ENCOUNTER — Ambulatory Visit (INDEPENDENT_AMBULATORY_CARE_PROVIDER_SITE_OTHER): Payer: Medicare Other | Admitting: Vascular Surgery

## 2021-11-22 ENCOUNTER — Telehealth (HOSPITAL_COMMUNITY): Payer: Self-pay | Admitting: Physical Therapy

## 2021-11-22 VITALS — BP 152/93 | HR 94 | Temp 98.2°F | Resp 20 | Ht 68.0 in | Wt 181.0 lb

## 2021-11-22 DIAGNOSIS — I739 Peripheral vascular disease, unspecified: Secondary | ICD-10-CM

## 2021-11-22 DIAGNOSIS — I872 Venous insufficiency (chronic) (peripheral): Secondary | ICD-10-CM | POA: Diagnosis not present

## 2021-11-22 NOTE — Telephone Encounter (Signed)
Patient called to cx 7/26 and 7/31 he saw the MD today and they put some special meds on his leg and wrapped it. He is to keep it on for a week and retrun to MD he will be back here on 12/06/21.

## 2021-11-23 ENCOUNTER — Ambulatory Visit (HOSPITAL_COMMUNITY): Payer: Medicare Other

## 2021-11-24 ENCOUNTER — Telehealth: Payer: Self-pay

## 2021-11-24 NOTE — Telephone Encounter (Signed)
Spoke with [facility] @ [facility] - faxed order, demographics and last office visit notes to [phone]. Leon Dennis stated that start of care would be Tuesday 8/1 for weekly unna boot changes to right leg.

## 2021-11-28 ENCOUNTER — Ambulatory Visit (HOSPITAL_COMMUNITY): Payer: PRIVATE HEALTH INSURANCE | Admitting: Physical Therapy

## 2021-11-29 ENCOUNTER — Telehealth: Payer: Self-pay

## 2021-11-29 NOTE — Telephone Encounter (Signed)
[facility] with [employer] called stating that the pt needed clarification on wound care orders. Pt is having serosanguinous drainage large enough to soak through the [place] covering the unna boot after a week's time.  Confirmed with Dr Lenell Antu to continue weekly unna boot changes, adding 4x4 gauze for drainage, if necessary. Confirmed understanding.

## 2021-11-30 ENCOUNTER — Ambulatory Visit: Payer: Medicare Other

## 2021-11-30 ENCOUNTER — Ambulatory Visit (INDEPENDENT_AMBULATORY_CARE_PROVIDER_SITE_OTHER): Payer: Medicare Other | Admitting: Podiatry

## 2021-11-30 ENCOUNTER — Encounter: Payer: Self-pay | Admitting: Podiatry

## 2021-11-30 DIAGNOSIS — E1159 Type 2 diabetes mellitus with other circulatory complications: Secondary | ICD-10-CM

## 2021-11-30 DIAGNOSIS — I739 Peripheral vascular disease, unspecified: Secondary | ICD-10-CM

## 2021-11-30 DIAGNOSIS — L97319 Non-pressure chronic ulcer of right ankle with unspecified severity: Secondary | ICD-10-CM | POA: Insufficient documentation

## 2021-11-30 DIAGNOSIS — M79675 Pain in left toe(s): Secondary | ICD-10-CM

## 2021-11-30 DIAGNOSIS — L97311 Non-pressure chronic ulcer of right ankle limited to breakdown of skin: Secondary | ICD-10-CM

## 2021-11-30 DIAGNOSIS — M79674 Pain in right toe(s): Secondary | ICD-10-CM

## 2021-11-30 DIAGNOSIS — B351 Tinea unguium: Secondary | ICD-10-CM

## 2021-11-30 DIAGNOSIS — D689 Coagulation defect, unspecified: Secondary | ICD-10-CM

## 2021-11-30 NOTE — Addendum Note (Signed)
Addended by: Helane Gunther on: 11/30/2021 02:12 PM   Modules accepted: Orders

## 2021-11-30 NOTE — Progress Notes (Signed)
This patient presents to the office with chief complaint of long thick nails and diabetic feet.  This patient  says there  is  no pain and discomfort in their feet.  This patient says there are long thick painful nails.  These nails are painful walking and wearing shoes.  Patient has no history of infection or drainage from both feet.  Patient is unable to  self treat his own nails . 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  This patient presents  to the office today for treatment of the  long nails and a foot evaluation due to history of  diabetes.  General Appearance  Alert, conversant and in no acute stress.  Vascular  Dorsalis pedis and posterior tibial  pulses are absent  bilaterally.  Capillary return is within normal limits  bilaterally. Temperature is within normal limits  bilaterally.  Neurologic  Senn-Weinstein monofilament wire test within normal limits  bilaterally. Muscle power within normal limits bilaterally.  Nails Thick disfigured discolored nails with subungual debris  from hallux to fifth toes bilaterally. No evidence of bacterial infection or drainage bilaterally.  Orthopedic  No limitations of motion of motion feet .  No crepitus or effusions noted.  No bony pathology or digital deformities noted.  Hallux limitus 1st MPJ  B/L.  Skin  normotropic skin with no porokeratosis noted bilaterally.  No signs of infections or ulcers noted.     Onychomycosis  Diabetes with no foot complications  Debride nails x 10.   Patient went to [facility] for casting.   RTC 3 months.   Cheney Ewart DPM   

## 2021-11-30 NOTE — Progress Notes (Addendum)
Patient in for diabetic shoe measurement today.  Unable to complete the process due to patient having a ulcer on the foot that is be treated with an Radio broadcast assistant.   Patient will come back for diabetic shoes when cleared by Dr. Juanetta Gosling at the end of the month.   Dr. Majel Homer treats patient's diabetes.

## 2021-12-06 ENCOUNTER — Ambulatory Visit (HOSPITAL_COMMUNITY): Payer: PRIVATE HEALTH INSURANCE | Admitting: Physical Therapy

## 2021-12-06 ENCOUNTER — Encounter (HOSPITAL_COMMUNITY): Payer: Self-pay

## 2021-12-13 ENCOUNTER — Encounter: Payer: Self-pay | Admitting: Cardiology

## 2021-12-13 ENCOUNTER — Ambulatory Visit (INDEPENDENT_AMBULATORY_CARE_PROVIDER_SITE_OTHER): Payer: Medicare Other | Admitting: Cardiology

## 2021-12-13 VITALS — BP 130/82 | HR 71 | Ht 69.0 in | Wt 189.2 lb

## 2021-12-13 DIAGNOSIS — I50810 Right heart failure, unspecified: Secondary | ICD-10-CM

## 2021-12-13 DIAGNOSIS — I272 Pulmonary hypertension, unspecified: Secondary | ICD-10-CM | POA: Diagnosis not present

## 2021-12-13 DIAGNOSIS — I4891 Unspecified atrial fibrillation: Secondary | ICD-10-CM

## 2021-12-13 MED ORDER — APIXABAN 5 MG PO TABS: 5.0000 mg | ORAL_TABLET | Freq: Two times a day (BID) | ORAL | 0 refills | Status: DC

## 2021-12-13 NOTE — Patient Instructions (Addendum)
Medication Instructions:  Continue all current medications.  Labwork: none  Testing/Procedures: none  Follow-Up: 4 months   Any Other Special Instructions Will Be Listed Below (If Applicable). Call the office in September when you are ready to move forward with the Right heart cath.   If you need a refill on your cardiac medications before your next appointment, please call your pharmacy.

## 2021-12-13 NOTE — Progress Notes (Signed)
Clinical Summary Leon Dennis is a 76 y.o.male seen today for follow up of the following medical problems.    Afib - new diagnosis during 06/2021 visit with pcp - from notes prior trial of DCCV was unsuccesful 08/22/21 - no recent palpitations.  - home HRs 70s to 80s by his watch.   - no recent palpitations - no bleeding on eliquis.      2. Chronic right sided HF - 07/2021 echo: LVEF 55-60%, indet diastolic, mild to mod RV dysfunction, mod RV enlargement, moderate pulm HTN PASP 51. Severe LAE - from notes patient turned down RHC - wti 192-->184 lbs since 08/29/21 appt - long history of HTN age 33, severe LAE one cho, mild LVH   - awaiting sleep study. Has home device but has not started yet.    - breathing is doing well - home weights stable 180-181 lbs which has been his baseline - taking torsemide 20mg  qod - Cr down to 1.37 on most recent labs     3. CKD 3A - followed by Leon     4. DM2    5. Foot ulcer - followe dby vascular, podiatry   SH: his wife Leon Dennis is also a patient of [facility] Past Medical History:  Diagnosis Date   CHF (congestive heart failure) (HCC)    Diabetes mellitus without complication (HCC)    Hypertension    Kidney failure      No Known Allergies   Current Outpatient Medications  Medication Sig Dispense Refill   acetaminophen (TYLENOL) 500 MG tablet Take 1,000 mg by mouth every 4 (four) hours as needed for mild pain.     amLODipine (NORVASC) 10 MG tablet Take 10 mg by mouth daily.     apixaban (ELIQUIS) 5 MG TABS tablet Take 5 mg by mouth 2 (two) times daily.     busPIRone (BUSPAR) 5 MG tablet Take 5 mg by mouth 3 (three) times daily.     dapagliflozin propanediol (FARXIGA) 5 MG TABS tablet Take 5 mg by mouth daily.     glimepiride (AMARYL) 4 MG tablet Take 4 mg by mouth in the morning and at bedtime.     hydrALAZINE (APRESOLINE) 25 MG tablet Take 1 tablet (25 mg total) by mouth every 8 (eight) hours. 90 tablet 6    lisinopril (ZESTRIL) 40 MG tablet Take 40 mg by mouth daily.     magnesium chloride (SLOW-MAG) 64 MG TBEC SR tablet Take 64 mg by mouth daily.     melatonin 5 MG TABS Take 5 mg by mouth at bedtime as needed (sleep).     metoprolol tartrate (LOPRESSOR) 100 MG tablet Take 100 mg by mouth 2 (two) times daily.     Multiple Vitamin (MULTIVITAMIN) tablet Take 1 tablet by mouth daily.     pioglitazone (ACTOS) 30 MG tablet Take 30 mg by mouth daily.     potassium chloride SA (KLOR-CON M) 20 MEQ tablet Take 2 tablets (40 mEq total) by mouth daily. 30 tablet 6   sodium chloride (OCEAN) 0.65 % SOLN nasal spray Place 1 spray into both nostrils as needed for congestion.     torsemide (DEMADEX) 20 MG tablet Take 1 tablet (20 mg total) by mouth daily. And as needed per insturctions 50 tablet 6   triamcinolone (NASACORT) 55 MCG/ACT AERO nasal inhaler Place 2 sprays into the nose daily as needed (allergies).     VITAMIN D PO Take 1 tablet by mouth daily.  No current facility-administered medications for this visit.     Past Surgical History:  Procedure Laterality Date   CARDIOVERSION N/A 08/22/2021   Procedure: CARDIOVERSION;  Surgeon: Leon Sprague, MD;  Location: [facility] ENDOSCOPY;  Service: Cardiovascular;  Laterality: N/A;     No Known Allergies    No family history on file.   Social History Leon. Dennis reports that he has never smoked. He has never used smokeless tobacco. Leon. Dennis reports current alcohol use.   Review of Systems CONSTITUTIONAL: No weight loss, fever, chills, weakness or fatigue.  HEENT: Eyes: No visual loss, blurred vision, double vision or yellow sclerae.No hearing loss, sneezing, congestion, runny nose or sore throat.  SKIN: No rash or itching.  CARDIOVASCULAR: per hpi RESPIRATORY: No shortness of breath, cough or sputum.  GASTROINTESTINAL: No anorexia, nausea, vomiting or diarrhea. No abdominal pain or blood.  GENITOURINARY: No burning on urination, no  polyuria NEUROLOGICAL: No headache, dizziness, syncope, paralysis, ataxia, numbness or tingling in the extremities. No change in bowel or bladder control.  MUSCULOSKELETAL: No muscle, back pain, joint pain or stiffness.  LYMPHATICS: No enlarged nodes. No history of splenectomy.  PSYCHIATRIC: No history of depression or anxiety.  ENDOCRINOLOGIC: No reports of sweating, cold or heat intolerance. No polyuria or polydipsia.  Marland Kitchen   Physical Examination Today's Vitals   12/13/21 1320  BP: 130/82  Pulse: 71  SpO2: 90%  Weight: 189 lb 3.2 oz (85.8 kg)  Height: 5\' 9"  (1.753 m)   Body mass index is 27.94 kg/m.  Gen: resting comfortably, no acute distress HEENT: no scleral icterus, pupils equal round and reactive, no palptable cervical adenopathy,  CV: RRR, no m/r/g no jvd Resp: Clear to auscultation bilaterally GI: abdomen is soft, non-tender, non-distended, normal bowel sounds, no hepatosplenomegaly MSK: extremities are warm, no edema.  Skin: warm, no rash Neuro:  no focal deficits Psych: appropriate affect   Diagnostic Studies     Assessment and Plan   1.Chronic RV failure/Pulmonary HTN/HFpEF - moderate pulm HTN by echo with mild to moderate RV dysfunction. Could not interpret diastolic fxn by echo but with severe LAE would suggest long standing elevated LA pressures. - awaiting PFTs, sleep study. He has continued to want to defer on RHC. Likely to have a group II component given severe LAE, will look for other potential causes. Perhaps VQ as well in the future  -discussed again completing his sleep study and PFTs, he will reschedule them. He will call in Sept when ready to proceed with RHC. Right now getting over leg wound with multiple doctor visits, has wanted to postpone testing.      2. Afib/acquired thrombophilia - failed DCCV while admitted - rates controlled on lopressor alone - we will continue current meds including eliquis for stroke prevention   F/u 4 months                02-17-1978, M.D.

## 2021-12-19 NOTE — Progress Notes (Unsigned)
[facility] [place]  ASSESSMENT / PLAN: Leon Dennis is a 76 y.o. male with right lateral heel ulcer just above the lateral malleolus.  This has improved greatly with multilayer compression therapy.  I suspect he will heal in the coming weeks.  I have encouraged him to follow-up with me as needed should the wound deteriorate or recur.  Otherwise he can follow-up with me as needed.  CHIEF COMPLAINT: Right ankle ulcer  HISTORY OF PRESENT ILLNESS: Leon Dennis is a 76 y.o. male referred to clinic for evaluation of right ankle ulcer.  He had noninvasive testing performed at an outside facility prior to my evaluation.  He reports the ulcer developed after scraping his ankle on his toolbox.  This is progressively worsened over the course of many weeks.  He has been under the care of wound care center, who is concerned he had arterial ulceration, prompting referral for noninvasive testing.  Patient reports no antecedent symptoms of claudication, ischemic rest pain, or prior ulceration.  He did have significant swelling and surrounding cellulitis about the ulcer, which has improved with local care and antibiotics.  12/20/21: Returns to clinic for evaluation.  He is doing quite well overall.  He is very pleased with the progress he is making.  The wound has shrunk considerably in volume.  Past Medical History:  Diagnosis Date   CHF (congestive heart failure) (HCC)    Diabetes mellitus without complication (HCC)    Hypertension    Kidney failure     Past Surgical History:  Procedure Laterality Date   CARDIOVERSION N/A 08/22/2021   Procedure: CARDIOVERSION;  Surgeon: Meriam Sprague, MD;  Location: [facility] ENDOSCOPY;  Service: Cardiovascular;  Laterality: N/A;    No family history on file.  Social History   Socioeconomic History   Marital status: Married    Spouse name: Not on file   Number of children: Not on file   Years of education: Not on file   Highest  education level: Not on file  Occupational History   Not on file  Tobacco Use   Smoking status: Never   Smokeless tobacco: Never  Substance and Sexual Activity   Alcohol use: Yes    Comment: occ.wine   Drug use: Never   Sexual activity: Not on file  Other Topics Concern   Not on file  Social History Narrative   Not on file   Social Determinants of Health   Financial Resource Strain: Not on file  Food Insecurity: Not on file  Transportation Needs: Not on file  Physical Activity: Not on file  Stress: Not on file  Social Connections: Not on file  Intimate Partner Violence: Not on file    No Known Allergies  Current Outpatient Medications  Medication Sig Dispense Refill   acetaminophen (TYLENOL) 500 MG tablet Take 1,000 mg by mouth every 4 (four) hours as needed for mild pain.     amLODipine (NORVASC) 10 MG tablet Take 10 mg by mouth daily.     apixaban (ELIQUIS) 5 MG TABS tablet Take 1 tablet (5 mg total) by mouth 2 (two) times daily. 42 tablet 0   busPIRone (BUSPAR) 5 MG tablet Take 5 mg by mouth 3 (three) times daily.     dapagliflozin propanediol (FARXIGA) 5 MG TABS tablet Take 5 mg by mouth daily.     glimepiride (AMARYL) 4 MG tablet Take 4 mg by mouth in the morning and at bedtime.     hydrALAZINE (APRESOLINE) 25 MG  tablet Take 1 tablet (25 mg total) by mouth every 8 (eight) hours. 90 tablet 6   lisinopril (ZESTRIL) 40 MG tablet Take 40 mg by mouth daily.     magnesium chloride (SLOW-MAG) 64 MG TBEC SR tablet Take 64 mg by mouth daily.     melatonin 5 MG TABS Take 5 mg by mouth at bedtime as needed (sleep).     metoprolol tartrate (LOPRESSOR) 100 MG tablet Take 100 mg by mouth 2 (two) times daily.     Multiple Vitamin (MULTIVITAMIN) tablet Take 1 tablet by mouth daily.     pioglitazone (ACTOS) 30 MG tablet Take 30 mg by mouth daily.     potassium chloride SA (KLOR-CON M) 20 MEQ tablet Take 2 tablets (40 mEq total) by mouth daily. 30 tablet 6   sodium chloride (OCEAN)  0.65 % SOLN nasal spray Place 1 spray into both nostrils as needed for congestion.     torsemide (DEMADEX) 20 MG tablet Take 1 tablet (20 mg total) by mouth daily. And as needed per insturctions 50 tablet 6   triamcinolone (NASACORT) 55 MCG/ACT AERO nasal inhaler Place 2 sprays into the nose daily as needed (allergies).     VITAMIN D PO Take 1 tablet by mouth daily.     No current facility-administered medications for this visit.    PHYSICAL EXAM There were no vitals filed for this visit.  Well-appearing elderly man in no acute distress Regular rate and rhythm Unlabored breathing I am not able to appreciate any palpable pedal pulses He is less than 2-second capillary refill bilaterally Ulcer about the right lateral malleolus has nearly resolved.  PERTINENT LABORATORY AND RADIOLOGIC DATA  Most recent CBC    Latest Ref Rng & Units 08/22/2021   11:07 AM 08/18/2021    2:54 PM  CBC  WBC 4.0 - 10.5 K/uL 4.9  5.5   Hemoglobin 13.0 - 17.0 g/dL 65.7  84.6   Hematocrit 39.0 - 52.0 % 43.8  37.3   Platelets 150 - 400 K/uL 204  183      Most recent CMP    Latest Ref Rng & Units 08/29/2021    9:49 AM 08/22/2021   11:07 AM 08/21/2021    3:20 AM  CMP  Glucose 70 - 99 mg/dL 962  952  841   BUN 8 - 27 mg/dL 22  21  22    Creatinine 0.76 - 1.27 mg/dL  3.24  4.01   Sodium 134 - 144 mmol/L 137  140  136   Potassium 3.5 - 5.2 mmol/L 4.0  3.2  3.5   Chloride 96 - 106 mmol/L 100  101  103   CO2 20 - 29 mmol/L 24  28  25    Calcium 8.6 - 10.2 mg/dL 9.4  9.5  9.2   Total Protein 6.0 - 8.5 g/dL 7.0   6.9   Total Bilirubin 0.0 - 1.2 mg/dL 0.6   1.3   Alkaline Phos 44 - 121 IU/L 82   60   AST 0 - 40 IU/L 23   21   ALT 0 - 44 IU/L 13   11     Renal function CrCl cannot be calculated (Patient's most recent lab result is older than the maximum 21 days allowed.).  Hgb A1c MFr Bld (%)  Date Value  08/18/2021 7.3 (H)    LDL Cholesterol  Date Value Ref Range Status  08/19/2021 52 0 - 99  mg/dL Final    Comment:  Total Cholesterol/HDL:CHD Risk Coronary Heart Disease Risk Table                     Men   Women  1/2 Average Risk   3.4   3.3  Average Risk       5.0   4.4  2 X Average Risk   9.6   7.1  3 X Average Risk  23.4   11.0        Use the calculated Patient Ratio above and the CHD Risk Table to determine the patient's CHD Risk.        ATP III CLASSIFICATION (LDL):  <100     mg/dL   Optimal  062-376  mg/dL   Near or Above                    Optimal  130-159  mg/dL   Borderline  283-151  mg/dL   High  >761     mg/dL   Very High Performed at [facility], [address], [place], [place] [address]     Arterial ultrasound A focal velocity elevation of 278 cm/s was obtained at dist popliteal  artery with a VR of 2.1. 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 A  2nd focal velocity elevation was visualized, measuring 397 cm/s at prox  peroneal artery with a VR of 5.75.  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 50-74% stenosis noted  in the peroneal artery.   +-------+-----------+-----------+------------+------------+  ABI/TBIToday's ABIToday's TBIPrevious ABIPrevious TBI  +-------+-----------+-----------+------------+------------+  Right  0.92       0.56                                 +-------+-----------+-----------+------------+------------+  Left   1.85       0.68                                 +-------+-----------+-----------+------------+------------+    Rande Brunt. Lenell Antu, MD [facility] Phone Number: [phone] 12/19/2021 5:39 PM  Total time spent on preparing this encounter including chart review, data review, collecting history, examining the patient, coordinating care for this established patient, 20 minutes  Portions of  this report may have been transcribed using voice recognition software.  Every effort has been made to ensure accuracy; however, inadvertent computerized transcription errors may still be present.

## 2021-12-20 ENCOUNTER — Encounter: Payer: Self-pay | Admitting: Vascular Surgery

## 2021-12-20 ENCOUNTER — Ambulatory Visit (INDEPENDENT_AMBULATORY_CARE_PROVIDER_SITE_OTHER): Payer: Medicare Other | Admitting: Vascular Surgery

## 2021-12-20 VITALS — BP 151/94 | HR 77 | Temp 98.4°F | Resp 20 | Ht 69.0 in | Wt 186.0 lb

## 2021-12-20 DIAGNOSIS — I872 Venous insufficiency (chronic) (peripheral): Secondary | ICD-10-CM | POA: Diagnosis not present

## 2021-12-22 ENCOUNTER — Other Ambulatory Visit: Payer: Self-pay | Admitting: *Deleted

## 2021-12-22 MED ORDER — APIXABAN 5 MG PO TABS
5.0000 mg | ORAL_TABLET | Freq: Two times a day (BID) | ORAL | 3 refills | Status: DC
Start: 2021-12-22 — End: 2022-06-19

## 2022-01-05 ENCOUNTER — Telehealth: Payer: Self-pay | Admitting: Cardiology

## 2022-01-05 NOTE — Telephone Encounter (Signed)
Pt c/o swelling: STAT is pt has developed SOB within 24 hours  If swelling, where is the swelling located? Abdomen, eyelids, and right leg  How much weight have you gained and in what time span? 7 lbs in two weeks   Have you gained 3 pounds in a day or 5 pounds in a week? Doesn't know   Do you have a log of your daily weights (if so, list)? No   Are you currently taking a fluid pill? Yes  Are you currently SOB? No   Have you traveled recently? No   Wife called to report patient is still retaining fluid taking one tablet of torsemide daily. Home health nurse Morrie Sheldon was with patient and ended up taking the call stating she is wanting to know if torsemide can be increased for a few days due to this. Please advise.

## 2022-01-07 ENCOUNTER — Telehealth: Payer: Self-pay | Admitting: Student

## 2022-01-07 NOTE — Telephone Encounter (Signed)
   Patient's wife called answering service with concerns of significant weight gain.  Called patient and he handed the phone to his wife.  Wife states that he has swelling in his abdomen and it looks like "ascites" states he has gained about 13 pounds in the past month.  She also states his O2 sat on pulse ox is 90% (it was also 90% at last office visit) and the seems to be taking deep breaths.  However, patient states he feels fine.  He denies any shortness of breath at all.  Nuys any orthopnea or PND but wife states he has been grunting more at night.  Wife does state that he has some lower extremity swelling as well but it is mostly in his abdomen.  Wife is wondering if they could increase his torsemide.  He usually only takes my 20 mg every other day but has previously told he could take it every day if he has increased swelling.  He increased his torsemide to 20 mg once daily on Wednesday but no significant improvement.  Recommended increasing Torsemide to 20 mg twice daily for 3 days.  Also recommended he increase his potassium supplement to 40 low equivalents twice daily while for 3 days while on higher dose of torsemide.  I asked wife to give [place] a call back on Monday or Tuesday to let us know how patient is doing.  If h he continues to gain weight on this higher dose and has no improvement improvement in edema, he may need to go to the ED for IV diuresis.    I would also like to repeat a BMET in about 3 days to make sure renal function and potassium are stable.  He does have a home health nurse and wife states they can check this but just need an order.  Will route this phone note to the triage pool for assistance with this.  Corrin Parker, PA-C 01/07/2022 4:00 PM

## 2022-01-09 MED ORDER — TORSEMIDE 20 MG PO TABS: 40.0000 mg | ORAL_TABLET | Freq: Every day | ORAL | Status: DC

## 2022-01-09 NOTE — Telephone Encounter (Signed)
Patient notified and verbalized understanding. 

## 2022-01-09 NOTE — Telephone Encounter (Signed)
Increase torsemide to 40mg  daily and update on Friday please  Sunday MD

## 2022-01-09 NOTE — Telephone Encounter (Signed)
Spoke to pt who stated that he had a 3-4 lb weight gain this past week. Pt's weight was 194 lbs. Pt's wife called and spoke to [facility] [place] on the after hours line and was told to increase Torsemide to 20 mg tablets BID for 3 days. Pt stated that after doing so, weight has decreased to 187 lb- 7lb fluid loss. Pt stated that he is to have labs checked next week for Dr. Wolfgang Phoenix and would like to wait until then for BMET.   Please advise.

## 2022-01-10 ENCOUNTER — Telehealth: Payer: Self-pay | Admitting: *Deleted

## 2022-01-10 NOTE — Telephone Encounter (Signed)
If weight back down can go back to torsemide 20mg  daily, needs to weight daily if gains 3 lbs or more take torsemide 20mg  bid until resolved  MD

## 2022-01-10 NOTE — Telephone Encounter (Signed)
Received fax from [facility] - Eliquis approved from 01/10/2022 through 04/30/2022.

## 2022-01-10 NOTE — Telephone Encounter (Signed)
Patient notified and verbalized understanding. Patient had no other questions or concerns at this time.  

## 2022-01-22 ENCOUNTER — Other Ambulatory Visit: Payer: Self-pay | Admitting: Cardiology

## 2022-03-08 ENCOUNTER — Other Ambulatory Visit (HOSPITAL_COMMUNITY): Payer: Self-pay | Admitting: Nephrology

## 2022-03-08 ENCOUNTER — Encounter: Payer: Self-pay | Admitting: Cardiology

## 2022-03-08 DIAGNOSIS — E1122 Type 2 diabetes mellitus with diabetic chronic kidney disease: Secondary | ICD-10-CM

## 2022-03-08 DIAGNOSIS — R188 Other ascites: Secondary | ICD-10-CM

## 2022-03-08 NOTE — Telephone Encounter (Signed)
If taking torsemide 40mg  daily, would increase to 40mg  in AM and 20mg  in PM. Update on weights on Monday  MD

## 2022-03-15 ENCOUNTER — Ambulatory Visit (INDEPENDENT_AMBULATORY_CARE_PROVIDER_SITE_OTHER): Payer: Medicare Other

## 2022-03-15 ENCOUNTER — Ambulatory Visit (INDEPENDENT_AMBULATORY_CARE_PROVIDER_SITE_OTHER): Payer: Medicare Other | Admitting: Podiatry

## 2022-03-15 ENCOUNTER — Encounter: Payer: Self-pay | Admitting: Podiatry

## 2022-03-15 DIAGNOSIS — M79675 Pain in left toe(s): Secondary | ICD-10-CM

## 2022-03-15 DIAGNOSIS — M79674 Pain in right toe(s): Secondary | ICD-10-CM | POA: Diagnosis not present

## 2022-03-15 DIAGNOSIS — I739 Peripheral vascular disease, unspecified: Secondary | ICD-10-CM | POA: Diagnosis not present

## 2022-03-15 DIAGNOSIS — D689 Coagulation defect, unspecified: Secondary | ICD-10-CM | POA: Diagnosis not present

## 2022-03-15 DIAGNOSIS — E1159 Type 2 diabetes mellitus with other circulatory complications: Secondary | ICD-10-CM

## 2022-03-15 DIAGNOSIS — B351 Tinea unguium: Secondary | ICD-10-CM

## 2022-03-15 DIAGNOSIS — M205X1 Other deformities of toe(s) (acquired), right foot: Secondary | ICD-10-CM

## 2022-03-15 DIAGNOSIS — M205X2 Other deformities of toe(s) (acquired), left foot: Secondary | ICD-10-CM

## 2022-03-15 NOTE — Progress Notes (Signed)
Patient presents to the office today for diabetic shoe and insole measuring.  Patient was measured with brannock device to determine size and width for 1 pair of extra depth shoes and foam casted for 3 pair of insoles.   ABN signed.   Documentation of medical necessity will be sent to patient's treating diabetic doctor to verify and sign.   Patient's diabetic provider: Dr. Majel Homer   Shoes and insoles will be ordered at that time and patient will be notified for an appointment for fitting when they arrive.   Brannock measurement: 11 WIDE  Patient shoe selection-   1st   Shoe choice:   APEX [id number]  2nd  Shoe choice:   APEX [id number]  Shoe size ordered:  11 WIDE

## 2022-03-15 NOTE — Progress Notes (Signed)
This patient presents to the office with chief complaint of long thick nails and diabetic feet.  This patient  says there  is  no pain and discomfort in their feet.  This patient says there are long thick painful nails.  These nails are painful walking and wearing shoes.  Patient has no history of infection or drainage from both feet.  Patient is unable to  self treat his own nails . 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  This patient presents  to the office today for treatment of the  long nails and a foot evaluation due to history of  diabetes.  General Appearance  Alert, conversant and in no acute stress.  Vascular  Dorsalis pedis and posterior tibial  pulses are absent  bilaterally.  Capillary return is within normal limits  bilaterally. Temperature is within normal limits  bilaterally.  Neurologic  Senn-Weinstein monofilament wire test within normal limits  bilaterally. Muscle power within normal limits bilaterally.  Nails Thick disfigured discolored nails with subungual debris  from hallux to fifth toes bilaterally. No evidence of bacterial infection or drainage bilaterally.  Orthopedic  No limitations of motion of motion feet .  No crepitus or effusions noted.  No bony pathology or digital deformities noted.  Hallux limitus 1st MPJ  B/L.  Skin  normotropic skin with no porokeratosis noted bilaterally.  No signs of infections or ulcers noted.     Onychomycosis  Diabetes with no foot complications  Debride nails x 10.   Patient went to [facility] for casting.   RTC 3 months.   Helane Gunther DPM

## 2022-03-17 ENCOUNTER — Ambulatory Visit (HOSPITAL_COMMUNITY)
Admission: RE | Admit: 2022-03-17 | Discharge: 2022-03-17 | Disposition: A | Discharge status: Home / Self Care | Payer: Medicare Other | Source: Ambulatory Visit | Attending: Nephrology | Admitting: Nephrology

## 2022-03-17 DIAGNOSIS — E1122 Type 2 diabetes mellitus with diabetic chronic kidney disease: Secondary | ICD-10-CM | POA: Insufficient documentation

## 2022-03-17 DIAGNOSIS — R188 Other ascites: Secondary | ICD-10-CM | POA: Diagnosis present

## 2022-03-31 ENCOUNTER — Ambulatory Visit: Payer: Medicare Other | Attending: Cardiology | Admitting: Cardiology

## 2022-03-31 ENCOUNTER — Encounter: Payer: Self-pay | Admitting: Cardiology

## 2022-03-31 VITALS — BP 118/62 | HR 68 | Ht 68.0 in | Wt 195.0 lb

## 2022-03-31 DIAGNOSIS — G4733 Obstructive sleep apnea (adult) (pediatric): Secondary | ICD-10-CM | POA: Insufficient documentation

## 2022-03-31 DIAGNOSIS — I50812 Chronic right heart failure: Secondary | ICD-10-CM | POA: Insufficient documentation

## 2022-03-31 DIAGNOSIS — R0602 Shortness of breath: Secondary | ICD-10-CM

## 2022-03-31 NOTE — Progress Notes (Signed)
Clinical Summary Leon Dennis is a 76 y.o.male seen today for follow up of the following medical problems.    Afib - new diagnosis during 06/2021 visit with pcp - from notes prior trial of DCCV was unsuccesful 08/22/21 - no recent palpitations.  - home HRs 70s to 80s by his watch.    - denies any palpitations. HRs by watch 59-70s.  - no bleeding on eliquis     2. Chronic right sided HF - 07/2021 echo: LVEF 55-60%, indet diastolic, mild to mod RV dysfunction, mod RV enlargement, moderate pulm HTN PASP 51. Severe LAE - from notes patient turned down RHC - wti 192-->184 lbs since 08/29/21 appt - long history of HTN age 89, severe LAE on echo, mild LVH     - some swelling at times, some improvement though since last visit - takes torsemide 40mg  daily, 20mg  in PM.  - 02/10/22 Cr 1.51, prior range 1.3-1.5 - abd by nephrolgy, mild ascites - home scale 192 lbs, stable 3-4 weeks.  - some DOE with long distances  3. CKD 3A - followed by Dr 02/12/22     4. DM2    5. Foot ulcer - followe dby vascular, podiatry - has resolved per patient report.    SH: his wife Leon Dennis is also a patient of [facility] Past Medical History:  Diagnosis Date   CHF (congestive heart failure) (HCC)    Diabetes mellitus without complication (HCC)    Hypertension    Kidney failure      No Known Allergies   Current Outpatient Medications  Medication Sig Dispense Refill   acetaminophen (TYLENOL) 500 MG tablet Take 1,000 mg by mouth every 4 (four) hours as needed for mild pain.     amLODipine (NORVASC) 10 MG tablet Take 10 mg by mouth daily.     apixaban (ELIQUIS) 5 MG TABS tablet Take 1 tablet (5 mg total) by mouth 2 (two) times daily. 180 tablet 3   busPIRone (BUSPAR) 5 MG tablet Take 5 mg by mouth 3 (three) times daily.     FARXIGA 10 MG TABS tablet Take 10 mg by mouth daily.     glimepiride (AMARYL) 4 MG tablet Take 4 mg by mouth in the morning and at bedtime.     hydrALAZINE  (APRESOLINE) 25 MG tablet Take 1 tablet (25 mg total) by mouth every 8 (eight) hours. 90 tablet 6   lisinopril (ZESTRIL) 40 MG tablet Take 40 mg by mouth daily.     magnesium chloride (SLOW-MAG) 64 MG TBEC SR tablet Take 64 mg by mouth daily.     metoprolol tartrate (LOPRESSOR) 100 MG tablet Take 100 mg by mouth 2 (two) times daily.     Multiple Vitamin (MULTIVITAMIN) tablet Take 1 tablet by mouth daily.     pioglitazone (ACTOS) 30 MG tablet Take 30 mg by mouth daily.     potassium chloride SA (KLOR-CON M) 20 MEQ tablet TAKE 2 TABLETS BY MOUTH DAILY. 180 tablet 3   sodium chloride (OCEAN) 0.65 % SOLN nasal spray Place 1 spray into both nostrils as needed for congestion.     torsemide (DEMADEX) 20 MG tablet Take 2 tablets (40 mg total) by mouth daily. And as needed per insturctions     traMADol (ULTRAM) 50 MG tablet Take 50-100 mg by mouth 4 (four) times daily as needed.     triamcinolone (NASACORT) 55 MCG/ACT AERO nasal inhaler Place 2 sprays into the nose daily as needed (  allergies).     VITAMIN D PO Take 1 tablet by mouth daily.     No current facility-administered medications for this visit.     Past Surgical History:  Procedure Laterality Date   CARDIOVERSION N/A 08/22/2021   Procedure: CARDIOVERSION;  Surgeon: Meriam Sprague, MD;  Location: [facility] ENDOSCOPY;  Service: Cardiovascular;  Laterality: N/A;     No Known Allergies    No family history on file.   Social History Leon Dennis reports that he has never smoked. He has never used smokeless tobacco. Leon Dennis reports current alcohol use.   Review of Systems CONSTITUTIONAL: No weight loss, fever, chills, weakness or fatigue.  HEENT: Eyes: No visual loss, blurred vision, double vision or yellow sclerae.No hearing loss, sneezing, congestion, runny nose or sore throat.  SKIN: No rash or itching.  CARDIOVASCULAR: per hpi RESPIRATORY: No shortness of breath, cough or sputum.  GASTROINTESTINAL: No anorexia, nausea,  vomiting or diarrhea. No abdominal pain or blood.  GENITOURINARY: No burning on urination, no polyuria NEUROLOGICAL: No headache, dizziness, syncope, paralysis, ataxia, numbness or tingling in the extremities. No change in bowel or bladder control.  MUSCULOSKELETAL: No muscle, back pain, joint pain or stiffness.  LYMPHATICS: No enlarged nodes. No history of splenectomy.  PSYCHIATRIC: No history of depression or anxiety.  ENDOCRINOLOGIC: No reports of sweating, cold or heat intolerance. No polyuria or polydipsia.  Marland Kitchen   Physical Examination Today's Vitals   03/31/22 1312  BP: 118/62  Pulse: 68  SpO2: 94%  Weight: 195 lb (88.5 kg)  Height: 5\' 8"  (1.727 m)   Body mass index is 29.65 kg/m.  Gen: resting comfortably, no acute distress HEENT: no scleral icterus, pupils equal round and reactive, no palptable cervical adenopathy,  CV: RRR, no m/r/g no jvd Resp: Clear to auscultation bilaterally GI: abdomen is soft, non-tender, non-distended, normal bowel sounds, no hepatosplenomegaly MSK: extremities are warm, no edema.  Skin: warm, no rash Neuro:  no focal deficits Psych: appropriate affect     Assessment and Plan   1.Chronic RV failure/Pulmonary HTN/HFpEF - moderate pulm HTN by echo with mild to moderate RV dysfunction. Could not interpret diastolic fxn by echo but with severe LAE would suggest long standing elevated LA pressures. - he had previously wanted to wait on any additional testing. Now agrees to proceed with PFTs, OSA evaluation. Pending results would plan for likely RHC, possibly VQ scan - appears euvolemic today, continue current torsemide.     2. Afib/acquired thrombophilia - failed DCCV while admitted - rates controlled on lopressor alone - no symptoms, continue current meds including eliquis for stroke preventin.      , M.D.

## 2022-03-31 NOTE — Patient Instructions (Signed)
Medication Instructions:  Continue all current medications.  Labwork: none  Testing/Procedures: Your physician has recommended that you have a pulmonary function test. Pulmonary Function Tests are a group of tests that measure how well air moves in and out of your lungs. Office will contact with results via phone, letter or mychart.     Follow-Up: 3 months   Any Other Special Instructions Will Be Listed Below (If Applicable). You have been referred to Pulmonary    If you need a refill on your cardiac medications before your next appointment, please call your pharmacy.

## 2022-04-03 ENCOUNTER — Encounter: Payer: Self-pay | Admitting: Cardiology

## 2022-04-04 ENCOUNTER — Other Ambulatory Visit: Payer: Self-pay | Admitting: *Deleted

## 2022-04-04 MED ORDER — TORSEMIDE 20 MG PO TABS: ORAL_TABLET | ORAL | 3 refills | Status: DC

## 2022-04-04 MED ORDER — POTASSIUM CHLORIDE CRYS ER 20 MEQ PO TBCR: 40.0000 meq | EXTENDED_RELEASE_TABLET | Freq: Every day | ORAL | 3 refills | Status: DC

## 2022-04-07 ENCOUNTER — Other Ambulatory Visit: Payer: Self-pay | Admitting: *Deleted

## 2022-04-07 ENCOUNTER — Telehealth: Payer: Self-pay | Admitting: Cardiology

## 2022-04-07 ENCOUNTER — Encounter: Payer: Self-pay | Admitting: *Deleted

## 2022-04-07 MED ORDER — TORSEMIDE 20 MG PO TABS: ORAL_TABLET | ORAL | 3 refills | Status: DC

## 2022-04-07 NOTE — Telephone Encounter (Signed)
Leon Dennis's pharmacy told patient that they haven't received refill on torsemide.

## 2022-04-07 NOTE — Telephone Encounter (Signed)
Replied via mychart.

## 2022-05-16 ENCOUNTER — Encounter: Payer: Self-pay | Admitting: Cardiology

## 2022-05-16 ENCOUNTER — Telehealth: Payer: Self-pay | Admitting: Cardiology

## 2022-05-16 NOTE — Telephone Encounter (Signed)
Called pt he states he has gained some weight, about 7 pounds since Christmas. Today his wife checked his O2 it was around 90% Pt taking medications as prescribed. "I just wanted to know if I needed to do something different or change some medications." Last OV 12/1.

## 2022-05-16 NOTE — Telephone Encounter (Signed)
Pt is returning call. Requesting return call.

## 2022-05-16 NOTE — Telephone Encounter (Signed)
Pt c/o swelling: STAT is pt has developed SOB within 24 hours  How much weight have you gained and in what time span? 7 lbs since christmas   If swelling, where is the swelling located? no  Are you currently taking a fluid pill? Yes   Are you currently SOB? No, but do have it sometimes   Do you have a log of your daily weights (if so, list)?    Have you gained 3 pounds in a day or 5 pounds in a week? No   Have you traveled recently? No

## 2022-05-17 NOTE — Telephone Encounter (Signed)
Patient informed and verbalized understanding of plan. 

## 2022-05-17 NOTE — Telephone Encounter (Signed)
Message started through mychart-please see for details Baseline weight 190-195 lbs Weight yesterday 202 lbs. Denies swelling in legs. Reports SOB with activity. Denies dizziness and chest pain. Did not sound SOB. Able to make full sentences without breaks or pauses Reports being able to sleep lying down with 2 pillows and sleeps well.  Reports not voiding as much as he should Medications reviewed. Reports taking torsemide 40 mg in the morning and 20 mg in the evening & potassium 40 meq daily.  Reports last lab work was 04/03/22-available for review in chart Advised to contact nephrologist Leon Dennis) Advised message would be sent to provider for review Verbalized understanding of plan.

## 2022-05-17 NOTE — Telephone Encounter (Signed)
From nephrology note 12/27 he was 206 at that visit without signs of fluid overload. If just weight gain without other assocaited symptoms would continue current meds, if were to develop swelling of SOb then would need to adjust diuretic   Zandra Abts MD

## 2022-05-23 ENCOUNTER — Encounter (HOSPITAL_COMMUNITY): Payer: PRIVATE HEALTH INSURANCE

## 2022-05-24 ENCOUNTER — Ambulatory Visit (INDEPENDENT_AMBULATORY_CARE_PROVIDER_SITE_OTHER): Payer: Medicare Other | Admitting: Pulmonary Disease

## 2022-05-24 ENCOUNTER — Encounter: Payer: Self-pay | Admitting: Pulmonary Disease

## 2022-05-24 VITALS — BP 120/66 | HR 68 | Temp 98.4°F | Ht 69.0 in | Wt 202.0 lb

## 2022-05-24 DIAGNOSIS — I2721 Secondary pulmonary arterial hypertension: Secondary | ICD-10-CM | POA: Diagnosis not present

## 2022-05-24 DIAGNOSIS — R0683 Snoring: Secondary | ICD-10-CM | POA: Diagnosis not present

## 2022-05-24 NOTE — Patient Instructions (Signed)
  X Home sleep test  Right heart cath recommended

## 2022-05-24 NOTE — Assessment & Plan Note (Signed)
He does not seem to have any evidence of lung disease.  He is a never smoker.  Reasonable to proceed with home sleep test as a screening study for sleep disordered breathing. He is more likely to have WHO 2 pulmonary hypertension but would need right heart cath to clarify

## 2022-05-24 NOTE — Progress Notes (Signed)
Subjective:    Patient ID: Leon Dennis, male    DOB: Feb 23, 1946, 77 y.o.   MRN: [id number]  HPI  77 year old never smoker with chronic cor pulmonale who is referred by cardiology for evaluation of sleep disordered breathing Per cardiology note, "e  previously wanted to wait on any additional testing. Now agrees to proceed with PFTs, OSA evaluation. 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 Epworth Sleepiness Scale is 3 and he denies daytime somnolence. Bedtime can be about midnight, sleep latency is minimal, he sleeps on his left side with 2 pillows, reports 1-2 nocturnal awakenings due to his cats and is out of bed at 6:30 AM feeling rested without dryness of mouth or headaches. He has lost 15 pounds over the last 2 years  There is no history suggestive of cataplexy, sleep paralysis or parasomnias  Chest x-ray 07/2021 appears normal without infiltrates or effusions Will have reviewed previous cardiology evaluation and echo and chest x-ray  Significant tests/ events reviewed  - 07/2021 echo: LVEF 44-31%, indet diastolic, mild to mod RV dysfunction, mod RV enlargement, moderate pulm HTN PASP 51. Severe LAE   Past Medical History:  Diagnosis Date   CHF (congestive heart failure) (HCC)    Diabetes mellitus without complication (HCC)    Hypertension    Kidney failure     Past Surgical History:  Procedure Laterality Date   CARDIOVERSION N/A 08/22/2021   Procedure: CARDIOVERSION;  Surgeon: Freada Bergeron, MD;  Location: [facility] ENDOSCOPY;  Service: Cardiovascular;  Laterality: N/A;   No Known Allergies  Social History   Socioeconomic History   Marital status: Married    Spouse name: Not on file   Number of children: Not on file   Years of education: Not on file    Highest education level: Not on file  Occupational History   Not on file  Tobacco Use   Smoking status: Never   Smokeless tobacco: Never  Substance and Sexual Activity   Alcohol use: Yes    Comment: occ.wine   Drug use: Never   Sexual activity: Not on file  Other Topics Concern   Not on file  Social History Narrative   Not on file   Social Determinants of Health   Financial Resource Strain: Not on file  Food Insecurity: Not on file  Transportation Needs: Not on file  Physical Activity: Not on file  Stress: Not on file  Social Connections: Not on file  Intimate Partner Violence: Not on file   No family history on file.   Review of Systems  Reports lower extremity edema Constitutional: negative for anorexia, fevers and sweats  Eyes: negative for irritation, redness and visual disturbance  Ears, nose, mouth, throat, and face: negative for earaches, epistaxis, nasal congestion and sore throat  Respiratory: negative for cough, dyspnea on exertion, sputum and wheezing  Cardiovascular: negative for chest pain, dyspnea, orthopnea, palpitations and syncope  Gastrointestinal: negative for abdominal pain, constipation, diarrhea, melena, nausea and vomiting  Genitourinary:negative for dysuria, frequency and hematuria  Hematologic/lymphatic: negative for bleeding, easy bruising and lymphadenopathy  Musculoskeletal:negative for arthralgias, muscle weakness and stiff joints  Neurological: negative for coordination problems, gait problems, headaches and weakness  Endocrine: negative for diabetic symptoms including polydipsia, polyuria and weight loss  Objective:   Physical Exam  Gen. Pleasant, well-nourished, in no distress, normal affect ENT - no pallor,icterus, no post nasal drip Neck: No JVD, no thyromegaly, no carotid bruits Lungs: no use of accessory muscles, no dullness to percussion, clear without rales or rhonchi  Cardiovascular: Rhythm regular, heart sounds  normal, no  murmurs or gallops, no peripheral edema Abdomen: soft and non-tender, no hepatosplenomegaly, BS normal. Musculoskeletal: No deformities, no cyanosis or clubbing Neuro:  alert, non focal       Assessment & Plan:   OSA -low pretest probability but okay to proceed with home sleep test given high pulmonary pressures

## 2022-05-29 ENCOUNTER — Encounter: Payer: Self-pay | Admitting: Cardiology

## 2022-05-30 ENCOUNTER — Ambulatory Visit (HOSPITAL_COMMUNITY)
Admission: RE | Admit: 2022-05-30 | Discharge: 2022-05-30 | Disposition: A | Discharge status: Home / Self Care | Payer: Medicare Other | Source: Ambulatory Visit | Attending: Cardiology | Admitting: Cardiology

## 2022-05-30 DIAGNOSIS — R0602 Shortness of breath: Secondary | ICD-10-CM | POA: Insufficient documentation

## 2022-05-30 LAB — PULMONARY FUNCTION TEST
DL/VA % pred: 108 %
DL/VA: 4.27 ml/min/mmHg/L
DLCO unc % pred: 42 %
DLCO unc: 10.3 ml/min/mmHg
FEF 25-75 Post: 2.69 L/sec
FEF 25-75 Pre: 2.34 L/sec
FEF2575-%Change-Post: 14 %
FEF2575-%Pred-Post: 132 %
FEF2575-%Pred-Pre: 115 %
FEV1-%Change-Post: 3 %
FEV1-%Pred-Post: 50 %
FEV1-%Pred-Pre: 48 %
FEV1-Post: 1.43 L
FEV1-Pre: 1.39 L
FEV1FVC-%Change-Post: -2 %
FEV1FVC-%Pred-Pre: 130 %
FEV6-%Change-Post: 6 %
FEV6-%Pred-Post: 42 %
FEV6-%Pred-Pre: 39 %
FEV6-Post: 1.57 L
FEV6-Pre: 1.48 L
FEV6FVC-%Pred-Post: 106 %
FEV6FVC-%Pred-Pre: 106 %
FVC-%Change-Post: 6 %
FVC-%Pred-Post: 39 %
FVC-%Pred-Pre: 37 %
FVC-Post: 1.57 L
FVC-Pre: 1.48 L
Post FEV1/FVC ratio: 91 %
Post FEV6/FVC ratio: 100 %
Pre FEV1/FVC ratio: 94 %
Pre FEV6/FVC Ratio: 100 %
RV % pred: 247 %
RV: 6.28 L
TLC % pred: 122 %
TLC: 8.4 L

## 2022-05-30 MED ORDER — ALBUTEROL SULFATE (2.5 MG/3ML) 0.083% IN NEBU
2.5000 mg | INHALATION_SOLUTION | Freq: Once | RESPIRATORY_TRACT | Status: AC
  Administered 2022-05-30: 2.5 mg via RESPIRATORY_TRACT

## 2022-06-02 ENCOUNTER — Encounter: Payer: Self-pay | Admitting: Cardiology

## 2022-06-05 NOTE — Telephone Encounter (Signed)
Can increase torsemide to 40mg  bid and update [place] Thursday on symptoms  Zandra Abts MD

## 2022-06-06 ENCOUNTER — Encounter: Payer: Self-pay | Admitting: *Deleted

## 2022-06-06 ENCOUNTER — Other Ambulatory Visit: Payer: Self-pay | Admitting: *Deleted

## 2022-06-06 MED ORDER — TORSEMIDE 20 MG PO TABS: ORAL_TABLET | ORAL | Status: DC

## 2022-06-09 ENCOUNTER — Ambulatory Visit: Payer: Medicare Other

## 2022-06-09 DIAGNOSIS — G4733 Obstructive sleep apnea (adult) (pediatric): Secondary | ICD-10-CM

## 2022-06-09 DIAGNOSIS — R0683 Snoring: Secondary | ICD-10-CM

## 2022-06-15 ENCOUNTER — Telehealth: Payer: Self-pay | Admitting: Pulmonary Disease

## 2022-06-15 DIAGNOSIS — G4733 Obstructive sleep apnea (adult) (pediatric): Secondary | ICD-10-CM | POA: Diagnosis not present

## 2022-06-15 NOTE — Telephone Encounter (Signed)
HST showed severe OSA with AHI 42/ hr & low sat of 77% Oxygen drop was 7-year likely related to his pulmonary hypertension Suggest CPAP titration study to see if we can correct close with CPAP or oxygen will also be required  After the study, we can set him up with appropriate treatment Okay to order if he is willing to proceed

## 2022-06-16 ENCOUNTER — Ambulatory Visit (INDEPENDENT_AMBULATORY_CARE_PROVIDER_SITE_OTHER): Payer: Medicare Other

## 2022-06-16 DIAGNOSIS — E1159 Type 2 diabetes mellitus with other circulatory complications: Secondary | ICD-10-CM

## 2022-06-16 DIAGNOSIS — M205X1 Other deformities of toe(s) (acquired), right foot: Secondary | ICD-10-CM | POA: Diagnosis not present

## 2022-06-16 DIAGNOSIS — M205X2 Other deformities of toe(s) (acquired), left foot: Secondary | ICD-10-CM

## 2022-06-16 NOTE — Progress Notes (Signed)
Patient presents today to pick up diabetic shoes and insoles.  Patient was dispensed 1 pair of diabetic shoes and 3 pairs of foam casted diabetic insoles.   He tried on the shoes with the insoles and the fit was satisfactory.   Will follow up next year for new order.

## 2022-06-19 ENCOUNTER — Other Ambulatory Visit: Payer: Self-pay | Admitting: *Deleted

## 2022-06-19 MED ORDER — APIXABAN 5 MG PO TABS: 5.0000 mg | ORAL_TABLET | Freq: Two times a day (BID) | ORAL | 3 refills | Status: DC

## 2022-06-19 NOTE — Telephone Encounter (Signed)
Called and gave patient results. He voiced understanding.  He is hesitant right now to do CPAP Titration since he takes care of his wife and would need to plan around her to be away from her overnight.  He states he will call back and let us know and wants a few days to think about it.  Advised patient to call back at his convenience and ask for nurse when he calls back. Nothing further needed at this time.

## 2022-07-03 ENCOUNTER — Telehealth: Payer: Self-pay | Admitting: Cardiology

## 2022-07-03 NOTE — Telephone Encounter (Signed)
  Patient calling the office for samples of medication:   1.  What medication and dosage are you requesting samples for?  apixaban (ELIQUIS) 5 MG TABS tablet    2.  Are you currently out of this medication? Yes  Pt's wife said, they are waiting for the pt  assistance approval

## 2022-07-05 ENCOUNTER — Other Ambulatory Visit: Payer: Self-pay | Admitting: *Deleted

## 2022-07-05 MED ORDER — APIXABAN 5 MG PO TABS: 5.0000 mg | ORAL_TABLET | Freq: Two times a day (BID) | ORAL | 0 refills | Status: DC

## 2022-07-07 ENCOUNTER — Other Ambulatory Visit: Payer: Self-pay | Admitting: Cardiology

## 2022-07-10 ENCOUNTER — Ambulatory Visit: Payer: Medicare Other | Admitting: Podiatry

## 2022-07-17 ENCOUNTER — Ambulatory Visit (INDEPENDENT_AMBULATORY_CARE_PROVIDER_SITE_OTHER): Payer: Medicare Other | Admitting: Podiatry

## 2022-07-17 ENCOUNTER — Telehealth: Payer: Self-pay | Admitting: *Deleted

## 2022-07-17 ENCOUNTER — Encounter: Payer: Self-pay | Admitting: Podiatry

## 2022-07-17 DIAGNOSIS — I739 Peripheral vascular disease, unspecified: Secondary | ICD-10-CM | POA: Diagnosis not present

## 2022-07-17 DIAGNOSIS — M79674 Pain in right toe(s): Secondary | ICD-10-CM | POA: Diagnosis not present

## 2022-07-17 DIAGNOSIS — B351 Tinea unguium: Secondary | ICD-10-CM

## 2022-07-17 DIAGNOSIS — E1159 Type 2 diabetes mellitus with other circulatory complications: Secondary | ICD-10-CM | POA: Diagnosis not present

## 2022-07-17 DIAGNOSIS — M79675 Pain in left toe(s): Secondary | ICD-10-CM | POA: Diagnosis not present

## 2022-07-17 NOTE — Progress Notes (Signed)
This patient presents to the office with chief complaint of long thick nails and diabetic feet.  This patient  says there  is  no pain and discomfort in their feet.  This patient says there are long thick painful nails.  These nails are painful walking and wearing shoes.  Patient has no history of infection or drainage from both feet.  Patient is unable to  self treat his own nails . 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  This patient presents  to the office today for treatment of the  long nails and a foot evaluation due to history of  diabetes.  General Appearance  Alert, conversant and in no acute stress.  Vascular  Dorsalis pedis and posterior tibial  pulses are absent  bilaterally.  Capillary return is within normal limits  bilaterally. Temperature is within normal limits  bilaterally.  Neurologic  Senn-Weinstein monofilament wire test within normal limits  bilaterally. Muscle power within normal limits bilaterally.  Nails Thick disfigured discolored nails with subungual debris  from hallux to fifth toes bilaterally. No evidence of bacterial infection or drainage bilaterally.  Orthopedic  No limitations of motion of motion feet .  No crepitus or effusions noted.  No bony pathology or digital deformities noted.  Hallux limitus 1st MPJ  B/L.  Skin  normotropic skin with no porokeratosis noted bilaterally.  No signs of infections or ulcers noted.     Onychomycosis  Diabetes with no foot complications  Debride nails x 10.      RTC 3 months.   Gardiner Barefoot DPM

## 2022-07-17 NOTE — Telephone Encounter (Signed)
Received fax from [facility] - patient approved from 07/07/2022 through 05/01/2023.

## 2022-08-16 ENCOUNTER — Encounter: Payer: Self-pay | Admitting: Cardiology

## 2022-08-18 NOTE — Telephone Encounter (Signed)
Would need higher dosing of the torsemide. How often is he taking 6 tablets a day, I'm assuming 3 tablets(total of )  bid. Would recommend taking that dose bid x 4 days then back to prior dose. If has been taking  bid already then increase to  in AM and  in pm over the weekend and update [place] on Monday  Dominga Ferry MD

## 2022-08-22 ENCOUNTER — Ambulatory Visit: Payer: Medicare Other | Attending: Cardiology | Admitting: Cardiology

## 2022-08-22 ENCOUNTER — Telehealth: Payer: Self-pay | Admitting: Cardiology

## 2022-08-22 ENCOUNTER — Encounter: Payer: Self-pay | Admitting: Cardiology

## 2022-08-22 ENCOUNTER — Encounter: Payer: Self-pay | Admitting: *Deleted

## 2022-08-22 VITALS — BP 124/66 | HR 68 | Ht 68.0 in | Wt 208.2 lb

## 2022-08-22 DIAGNOSIS — Z01812 Encounter for preprocedural laboratory examination: Secondary | ICD-10-CM | POA: Insufficient documentation

## 2022-08-22 DIAGNOSIS — I272 Pulmonary hypertension, unspecified: Secondary | ICD-10-CM | POA: Diagnosis present

## 2022-08-22 DIAGNOSIS — I50812 Chronic right heart failure: Secondary | ICD-10-CM | POA: Insufficient documentation

## 2022-08-22 MED ORDER — TORSEMIDE 20 MG PO TABS: 60.0000 mg | ORAL_TABLET | Freq: Two times a day (BID) | ORAL | 3 refills | Status: DC

## 2022-08-22 NOTE — Progress Notes (Signed)
Clinical Summary Leon Dennis is a 77 y.o.male seen today for follow up of the following medical problems. This is a focused visit on history of chronic right sided HF   1. Chronic right sided HF - 07/2021 echo: LVEF 55-60%, indet diastolic, mild to mod RV dysfunction, mod RV enlargement, moderate pulm HTN PASP 51. Severe LAE - from notes patient turned down RHC - wti 192-->184 lbs since 08/29/21 appt - long history of HTN age 89, severe LAE on echo, mild LVH   - had initially turned down additional workup, last visit agreed with PFTs and sleep study Jan 2024 PFT some evidence of obstructive lung disease Sleep study: severe OSA  - by our scale up 6 lbs - abdomen distended, denies any LE edema - no orthopnea, some DOE with activities.  - taking torsemide  bid for the last 5 days. Down 4 lbs at home since increasing torsemide ot this dose.     Other medical problems not addressed this visit   Afib - new diagnosis during 06/2021 visit with pcp - from notes prior trial of DCCV was unsuccesful 08/22/21 - no recent palpitations.  - home HRs 70s to 80s by his watch.    - denies any palpitations. HRs by watch 59-70s.  - no bleeding on eliquis         3. CKD 3A - followed by Dr Wolfgang Phoenix     4. DM2    5. Foot ulcer - followe dby vascular, podiatry - has resolved per patient report.   6. OSA - severe OSA by recent sleep study - was to start cpap   SH: his wife Leon Dennis is also a patient of mine Past Medical History:  Diagnosis Date   CHF (congestive heart failure) (HCC)    Diabetes mellitus without complication (HCC)    Hypertension    Kidney failure      No Known Allergies   Current Outpatient Medications  Medication Sig Dispense Refill   acetaminophen (TYLENOL) 500 MG tablet Take 1,000 mg by mouth every 4 (four) hours as needed for mild pain.     amLODipine (NORVASC) 10 MG tablet Take 10 mg by mouth daily.     apixaban (ELIQUIS) 5 MG TABS  tablet Take 1 tablet (5 mg total) by mouth 2 (two) times daily. 28 tablet 0   busPIRone (BUSPAR) 5 MG tablet Take 5 mg by mouth 3 (three) times daily.     FARXIGA 10 MG TABS tablet Take 10 mg by mouth daily.     glimepiride (AMARYL) 4 MG tablet Take 4 mg by mouth in the morning and at bedtime.     hydrALAZINE (APRESOLINE) 25 MG tablet TAKE 1 TABLET EVERY 8 HOURS. 90 tablet 2   lisinopril (ZESTRIL) 40 MG tablet Take 40 mg by mouth daily.     metoprolol tartrate (LOPRESSOR) 100 MG tablet Take 100 mg by mouth 2 (two) times daily.     Multiple Vitamin (MULTIVITAMIN) tablet Take 1 tablet by mouth daily.     pioglitazone (ACTOS) 30 MG tablet Take 30 mg by mouth daily.     potassium chloride SA (KLOR-CON M) 20 MEQ tablet Take 2 tablets (40 mEq total) by mouth daily. 180 tablet 3   sodium chloride (OCEAN) 0.65 % SOLN nasal spray Place 1 spray into both nostrils as needed for congestion.     torsemide (DEMADEX) 20 MG tablet Take 2 tablets (40 mg total) by mouth in the morning AND  2 tablets (40 mg total) every evening.     triamcinolone (NASACORT) 55 MCG/ACT AERO nasal inhaler Place 2 sprays into the nose daily as needed (allergies).     VITAMIN D PO Take 1 tablet by mouth daily.     No current facility-administered medications for this visit.     Past Surgical History:  Procedure Laterality Date   CARDIOVERSION N/A 08/22/2021   Procedure: CARDIOVERSION;  Surgeon: Meriam Sprague, MD;  Location: [facility] ENDOSCOPY;  Service: Cardiovascular;  Laterality: N/A;     No Known Allergies    No family history on file.   Social History Mr. Knauff reports that he has never smoked. He has never used smokeless tobacco. Mr. Rolf reports current alcohol use.   Review of Systems CONSTITUTIONAL: No weight loss, fever, chills, weakness or fatigue.  HEENT: Eyes: No visual loss, blurred vision, double vision or yellow sclerae.No hearing loss, sneezing, congestion, runny nose or sore throat.  SKIN: No  rash or itching.  CARDIOVASCULAR: per hpi RESPIRATORY: per hpi GASTROINTESTINAL: No anorexia, nausea, vomiting or diarrhea. No abdominal pain or blood.  GENITOURINARY: No burning on urination, no polyuria NEUROLOGICAL: No headache, dizziness, syncope, paralysis, ataxia, numbness or tingling in the extremities. No change in bowel or bladder control.  MUSCULOSKELETAL: No muscle, back pain, joint pain or stiffness.  LYMPHATICS: No enlarged nodes. No history of splenectomy.  PSYCHIATRIC: No history of depression or anxiety.  ENDOCRINOLOGIC: No reports of sweating, cold or heat intolerance. No polyuria or polydipsia.  Marland Kitchen   Physical Examination Today's Vitals   08/22/22 1258  BP: 124/66  Pulse: 68  SpO2: 95%  Weight: 208 lb 3.2 oz (94.4 kg)  Height:  (1.727 m)   Body mass index is 31.66 kg/m.  Gen: resting comfortably, no acute distress HEENT: no scleral icterus, pupils equal round and reactive, no palptable cervical adenopathy,  CV: RRR, no mrg,. +JVD Resp: Clear to auscultation bilaterally GI: abdomen is soft, non-tender. +distended MSK: extremities are warm, trace bilateral edema Skin: warm, no rash Neuro:  no focal deficits Psych: appropriate affect   Diagnostic Studies  Jan 2024 PFT some evidence of emphysema Sleep study: severe OSA   Assessment and Plan  1.Chronic RV failure/Pulmonary HTN/HFpEF - moderate pulm HTN by echo with mild to moderate RV dysfunction. Could not interpret diastolic fxn by echo but with severe LAE would suggest long standing elevated LA pressures. - he had for several visits wanted to continue to wait on any additional testing but but finally did agree to PFTs and sleep study at last visit. He now agrees to RHC as well - recent weight gain, abdominal distension. Weight is starting to trend down after increasing torsemide to  bid. Check bmet/mg/bnp - repeat echo, plan for RHC. Likely VQ scan to follow. Ultiatematley once workup complete  refer to HF clinic.   Shared Decision Making/Informed Consent The risks [stroke (1 in 1000), death (1 in 1000), kidney failure [usually temporary] (1 in 500), bleeding (1 in 200), allergic reaction [possibly serious] (1 in 200)], benefits (diagnostic support and management of coronary artery disease) and alternatives of a cardiac catheterization were discussed in detail with Mr. Amick and he is willing to proceed.             Antoine Poche, M.D.

## 2022-08-22 NOTE — Patient Instructions (Addendum)
Medication Instructions:  Refilled Torsemide  twice a day   Continue all other medications.     Labwork: BMET, Mg, BNP, CBC - orders given   Testing/Procedures: Your physician has requested that you have an echocardiogram. Echocardiography is a painless test that uses sound waves to create images of your heart. It provides your doctor with information about the size and shape of your heart and how well your heart's chambers and valves are working. This procedure takes approximately one hour. There are no restrictions for this procedure. Please do NOT wear cologne, perfume, aftershave, or lotions (deodorant is allowed). Please arrive 15 minutes prior to your appointment time. Your physician has requested that you have a cardiac catheterization. Cardiac catheterization is used to diagnose and/or treat various heart conditions. Doctors may recommend this procedure for a number of different reasons. The most common reason is to evaluate chest pain. Chest pain can be a symptom of coronary artery disease (CAD), and cardiac catheterization can show whether plaque is narrowing or blocking your heart's arteries. This procedure is also used to evaluate the valves, as well as measure the blood flow and oxygen levels in different parts of your heart. For further information please visit [email or web address] Please follow instruction sheet, as given.   Follow-Up: Office will contact with results via phone, letter or mychart.   1 month   Any Other Special Instructions Will Be Listed Below (If Applicable).   If you need a refill on your cardiac medications before your next appointment, please call your pharmacy.

## 2022-08-22 NOTE — H&P (View-Only) (Signed)
    Clinical Summary Leon Dennis is a 76 y.o.male seen today for follow up of the following medical problems. This is a focused visit on history of chronic right sided HF   1. Chronic right sided HF - 07/2021 echo: LVEF 55-60%, indet diastolic, mild to mod RV dysfunction, mod RV enlargement, moderate pulm HTN PASP 51. Severe LAE - from notes patient turned down RHC - wti 192-->184 lbs since 08/29/21 appt - long history of HTN age 18, severe LAE on echo, mild LVH   - had initially turned down additional workup, last visit agreed with PFTs and sleep study Jan 2024 PFT some evidence of obstructive lung disease Sleep study: severe OSA  - by our scale up 6 lbs - abdomen distended, denies any LE edema - no orthopnea, some DOE with activities.  - taking torsemide 60mg bid for the last 5 days. Down 4 lbs at home since increasing torsemide ot this dose.     Other medical problems not addressed this visit   Afib - new diagnosis during 06/2021 visit with pcp - from notes prior trial of DCCV was unsuccesful 08/22/21 - no recent palpitations.  - home HRs 70s to 80s by his watch.    - denies any palpitations. HRs by watch 59-70s.  - no bleeding on eliquis         3. 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 DM2    5. Foot ulcer - followe dby vascular, podiatry - has resolved per patient report.   6. OSA - severe OSA by recent sleep study - was to start cpap   SH: his wife Leon Dennis is also a patient of mine Past Medical History:  Diagnosis Date   CHF (congestive heart failure) (HCC)    Diabetes mellitus without complication (HCC)    Hypertension    Kidney failure      No Known Allergies   Current Outpatient Medications  Medication Sig Dispense Refill   acetaminophen (TYLENOL) 500 MG tablet Take 1,000 mg by mouth every 4 (four) hours as needed for mild pain.     amLODipine (NORVASC) 10 MG tablet Take 10 mg by mouth daily.     apixaban (ELIQUIS) 5 MG TABS  tablet Take 1 tablet (5 mg total) by mouth 2 (two) times daily. 28 tablet 0   busPIRone (BUSPAR) 5 MG tablet Take 5 mg by mouth 3 (three) times daily.     FARXIGA 10 MG TABS tablet Take 10 mg by mouth daily.     glimepiride (AMARYL) 4 MG tablet Take 4 mg by mouth in the morning and at bedtime.     hydrALAZINE (APRESOLINE) 25 MG tablet TAKE 1 TABLET EVERY 8 HOURS. 90 tablet 2   lisinopril (ZESTRIL) 40 MG tablet Take 40 mg by mouth daily.     metoprolol tartrate (LOPRESSOR) 100 MG tablet Take 100 mg by mouth 2 (two) times daily.     Multiple Vitamin (MULTIVITAMIN) tablet Take 1 tablet by mouth daily.     pioglitazone (ACTOS) 30 MG tablet Take 30 mg by mouth daily.     potassium chloride SA (KLOR-CON M) 20 MEQ tablet Take 2 tablets (40 mEq total) by mouth daily. 180 tablet 3   sodium chloride (OCEAN) 0.65 % SOLN nasal spray Place 1 spray into both nostrils as needed for congestion.     torsemide (DEMADEX) 20 MG tablet Take 2 tablets (40 mg total) by mouth in the morning AND   2 tablets (40 mg total) every evening.     triamcinolone (NASACORT) 55 MCG/ACT AERO nasal inhaler Place 2 sprays into the nose daily as needed (allergies).     VITAMIN D PO Take 1 tablet by mouth daily.     No current facility-administered medications for this visit.     Past Surgical History:  Procedure Laterality Date   CARDIOVERSION N/A 08/22/2021   Procedure: CARDIOVERSION;  Surgeon: Pemberton, Heather E, MD;  Location: [facility] ENDOSCOPY;  Service: Cardiovascular;  Laterality: N/A;     No Known Allergies    No family history on file.   Social History Leon Dennis reports that he has never smoked. He has never used smokeless tobacco. Leon Dennis reports current alcohol use.   Review of Systems CONSTITUTIONAL: No weight loss, fever, chills, weakness or fatigue.  HEENT: Eyes: No visual loss, blurred vision, double vision or yellow sclerae.No hearing loss, sneezing, congestion, runny nose or sore throat.  SKIN: No  rash or itching.  CARDIOVASCULAR: per hpi RESPIRATORY: per hpi GASTROINTESTINAL: No anorexia, nausea, vomiting or diarrhea. No abdominal pain or blood.  GENITOURINARY: No burning on urination, no polyuria NEUROLOGICAL: No headache, dizziness, syncope, paralysis, ataxia, numbness or tingling in the extremities. No change in bowel or bladder control.  MUSCULOSKELETAL: No muscle, back pain, joint pain or stiffness.  LYMPHATICS: No enlarged nodes. No history of splenectomy.  PSYCHIATRIC: No history of depression or anxiety.  ENDOCRINOLOGIC: No reports of sweating, cold or heat intolerance. No polyuria or polydipsia.  .   Physical Examination Today's Vitals   08/22/22 1258  BP: 124/66  Pulse: 68  SpO2: 95%  Weight: 208 lb 3.2 oz (94.4 kg)  Height: 5' 8" (1.727 m)   Body mass index is 31.66 kg/m.  Gen: resting comfortably, no acute distress HEENT: no scleral icterus, pupils equal round and reactive, no palptable cervical adenopathy,  CV: RRR, no mrg,. +JVD Resp: Clear to auscultation bilaterally GI: abdomen is soft, non-tender. +distended MSK: extremities are warm, trace bilateral edema Skin: warm, no rash Neuro:  no focal deficits Psych: appropriate affect   Diagnostic Studies  Jan 2024 PFT some evidence of emphysema Sleep study: severe OSA   Assessment and Plan  1.Chronic RV failure/Pulmonary HTN/HFpEF - moderate pulm HTN by echo with mild to moderate RV dysfunction. Could not interpret diastolic fxn by echo but with severe LAE would suggest long standing elevated LA pressures. - he had for several visits wanted to continue to wait on any additional testing but but finally did agree to PFTs and sleep study at last visit. He now agrees to RHC as well - recent weight gain, abdominal distension. Weight is starting to trend down after increasing torsemide to 60mg bid. Check bmet/mg/bnp - repeat echo, plan for RHC. Likely VQ scan to follow. Ultiatematley once workup complete  refer to HF clinic.   Shared Decision Making/Informed Consent The risks [stroke (1 in 1000), death (1 in 1000), kidney failure [usually temporary] (1 in 500), bleeding (1 in 200), allergic reaction [possibly serious] (1 in 200)], benefits (diagnostic support and management of coronary artery disease) and alternatives of a cardiac catheterization were discussed in detail with Leon Dennis and he is willing to proceed.             Zoie Sarin F. Weslee Prestage, M.D. 

## 2022-08-22 NOTE — Telephone Encounter (Signed)
12:00 PM: RIGHT HEART CATH MAY 2,2024 with Kathleene Hazel, MD at [facility] INVASIVE CV LAB

## 2022-08-28 ENCOUNTER — Other Ambulatory Visit (HOSPITAL_COMMUNITY)
Admission: RE | Admit: 2022-08-28 | Discharge: 2022-08-28 | Disposition: A | Discharge status: Home / Self Care | Payer: Medicare Other | Source: Ambulatory Visit | Attending: Cardiology | Admitting: Cardiology

## 2022-08-28 DIAGNOSIS — I50812 Chronic right heart failure: Secondary | ICD-10-CM

## 2022-08-28 DIAGNOSIS — I272 Pulmonary hypertension, unspecified: Secondary | ICD-10-CM | POA: Insufficient documentation

## 2022-08-28 DIAGNOSIS — Z01812 Encounter for preprocedural laboratory examination: Secondary | ICD-10-CM | POA: Insufficient documentation

## 2022-08-28 LAB — BASIC METABOLIC PANEL
Anion gap: 13 (ref 5–15)
BUN: 37 mg/dL — ABNORMAL HIGH (ref 8–23)
CO2: 23 mmol/L (ref 22–32)
Calcium: 8.9 mg/dL (ref 8.9–10.3)
Chloride: 98 mmol/L (ref 98–111)
Creatinine, Ser: 1.77 mg/dL — ABNORMAL HIGH (ref 0.61–1.24)
GFR, Estimated: 39 mL/min — ABNORMAL LOW (ref >60)
Glucose, Bld: 214 mg/dL — ABNORMAL HIGH (ref 70–99)
Potassium: 4 mmol/L (ref 3.5–5.1)
Sodium: 134 mmol/L — ABNORMAL LOW (ref 135–145)

## 2022-08-28 LAB — CBC
HCT: 46 % (ref 39.0–52.0)
Hemoglobin: 15.1 g/dL (ref 13.0–17.0)
MCH: 29 pg (ref 26.0–34.0)
MCHC: 32.8 g/dL (ref 30.0–36.0)
MCV: 88.3 fL (ref 80.0–100.0)
Platelets: 200 10*3/uL (ref 150–400)
RBC: 5.21 MIL/uL (ref 4.22–5.81)
RDW: 16 % — ABNORMAL HIGH (ref 11.5–15.5)
WBC: 5.6 10*3/uL (ref 4.0–10.5)
nRBC: 0 % (ref 0.0–0.2)

## 2022-08-28 LAB — MAGNESIUM: Magnesium: 2.6 mg/dL — ABNORMAL HIGH (ref 1.7–2.4)

## 2022-08-28 LAB — BRAIN NATRIURETIC PEPTIDE: B Natriuretic Peptide: 801 pg/mL — ABNORMAL HIGH (ref 0.0–100.0)

## 2022-08-29 ENCOUNTER — Other Ambulatory Visit: Payer: Self-pay | Admitting: Cardiology

## 2022-08-29 DIAGNOSIS — I272 Pulmonary hypertension, unspecified: Secondary | ICD-10-CM

## 2022-08-29 MED ORDER — SODIUM CHLORIDE 0.9% FLUSH: 3.0000 mL | Freq: Two times a day (BID) | INTRAVENOUS | Status: AC

## 2022-08-30 ENCOUNTER — Telehealth: Payer: Self-pay | Admitting: *Deleted

## 2022-08-30 NOTE — Telephone Encounter (Signed)
Right Heart Cath at [facility] for: Thursday Aug 31, 2022 10:30 AM Arrival time [facility] Main Entrance A at: 8:30 AM  Nothing to eat after midnight prior to procedure, clear liquids until 5 AM day of procedure.  Medication instructions: -Hold:  Eliquis-none 08/29/22 until post procedure  Glimepiride/Farxiga/Actos-AM of procedure  Torsemide/KCl-AM of procedure Other usual morning medications can be taken with sips of water  Confirmed patient has responsible adult to drive home post procedure and be with patient first 24 hours after arriving home.  Plan to go home the same day, you will only stay overnight if medically necessary.  Reviewed procedure instructions with patient.  Patient aware procedure time has been changed from 12 Noon to 10:30 AM, arrive 8:30 AM.

## 2022-08-31 ENCOUNTER — Other Ambulatory Visit: Payer: Self-pay

## 2022-08-31 ENCOUNTER — Ambulatory Visit (HOSPITAL_COMMUNITY)
Admission: RE | Admit: 2022-08-31 | Discharge: 2022-08-31 | Disposition: A | Discharge status: Home / Self Care | Payer: Medicare Other | Attending: Cardiovascular Disease | Admitting: Cardiovascular Disease

## 2022-08-31 ENCOUNTER — Encounter (HOSPITAL_COMMUNITY)
Admission: RE | Disposition: A | Discharge status: Home / Self Care | Payer: Self-pay | Source: Home / Self Care | Attending: Cardiovascular Disease

## 2022-08-31 DIAGNOSIS — I5032 Chronic diastolic (congestive) heart failure: Secondary | ICD-10-CM | POA: Insufficient documentation

## 2022-08-31 DIAGNOSIS — G4733 Obstructive sleep apnea (adult) (pediatric): Secondary | ICD-10-CM | POA: Insufficient documentation

## 2022-08-31 DIAGNOSIS — I272 Pulmonary hypertension, unspecified: Secondary | ICD-10-CM | POA: Diagnosis not present

## 2022-08-31 DIAGNOSIS — E1122 Type 2 diabetes mellitus with diabetic chronic kidney disease: Secondary | ICD-10-CM | POA: Insufficient documentation

## 2022-08-31 DIAGNOSIS — E11621 Type 2 diabetes mellitus with foot ulcer: Secondary | ICD-10-CM | POA: Diagnosis not present

## 2022-08-31 DIAGNOSIS — I50812 Chronic right heart failure: Secondary | ICD-10-CM | POA: Insufficient documentation

## 2022-08-31 DIAGNOSIS — Z7984 Long term (current) use of oral hypoglycemic drugs: Secondary | ICD-10-CM | POA: Insufficient documentation

## 2022-08-31 DIAGNOSIS — N1831 Chronic kidney disease, stage 3a: Secondary | ICD-10-CM | POA: Insufficient documentation

## 2022-08-31 DIAGNOSIS — Z7901 Long term (current) use of anticoagulants: Secondary | ICD-10-CM | POA: Insufficient documentation

## 2022-08-31 DIAGNOSIS — I4891 Unspecified atrial fibrillation: Secondary | ICD-10-CM | POA: Diagnosis not present

## 2022-08-31 DIAGNOSIS — Z79899 Other long term (current) drug therapy: Secondary | ICD-10-CM | POA: Diagnosis not present

## 2022-08-31 HISTORY — PX: RIGHT HEART CATH: CATH118263

## 2022-08-31 LAB — GLUCOSE, CAPILLARY
Glucose-Capillary: 152 mg/dL — ABNORMAL HIGH (ref 70–99)
Glucose-Capillary: 143 mg/dL — ABNORMAL HIGH (ref 70–99)

## 2022-08-31 LAB — POCT I-STAT EG7
Acid-Base Excess: 2 mmol/L (ref 0.0–2.0)
Bicarbonate: 26.8 mmol/L (ref 20.0–28.0)
Calcium, Ion: 1.2 mmol/L (ref 1.15–1.40)
HCT: 46 % (ref 39.0–52.0)
Hemoglobin: 15.6 g/dL (ref 13.0–17.0)
O2 Saturation: 66 %
Potassium: 3.3 mmol/L — ABNORMAL LOW (ref 3.5–5.1)
Sodium: 139 mmol/L (ref 135–145)
TCO2: 28 mmol/L (ref 22–32)
pCO2, Ven: 40.6 mmHg — ABNORMAL LOW (ref 44–60)
pH, Ven: 7.428 (ref 7.25–7.43)
pO2, Ven: 33 mmHg (ref 32–45)

## 2022-08-31 SURGERY — RIGHT HEART CATH

## 2022-08-31 MED ORDER — MIDAZOLAM HCL 2 MG/2ML IJ SOLN
INTRAMUSCULAR | Status: DC | PRN
  Administered 2022-08-31: 1 mg via INTRAVENOUS

## 2022-08-31 MED ORDER — FENTANYL CITRATE (PF) 100 MCG/2ML IJ SOLN
INTRAMUSCULAR | Status: DC | PRN
  Administered 2022-08-31: 25 ug via INTRAVENOUS

## 2022-08-31 MED ORDER — SODIUM CHLORIDE 0.9 % IV SOLN: 250.0000 mL | INTRAVENOUS | Status: DC | PRN

## 2022-08-31 MED ORDER — LIDOCAINE HCL (PF) 1 % IJ SOLN
INTRAMUSCULAR | Status: AC
  Filled 2022-08-31: qty 30

## 2022-08-31 MED ORDER — HYDRALAZINE HCL 20 MG/ML IJ SOLN: 10.0000 mg | INTRAMUSCULAR | Status: DC | PRN

## 2022-08-31 MED ORDER — MIDAZOLAM HCL 2 MG/2ML IJ SOLN
INTRAMUSCULAR | Status: AC
  Filled 2022-08-31: qty 2

## 2022-08-31 MED ORDER — ACETAMINOPHEN 325 MG PO TABS: 650.0000 mg | ORAL_TABLET | ORAL | Status: DC | PRN

## 2022-08-31 MED ORDER — SODIUM CHLORIDE 0.9 % IV SOLN: INTRAVENOUS | Status: DC

## 2022-08-31 MED ORDER — SODIUM CHLORIDE 0.9% FLUSH: 3.0000 mL | Freq: Two times a day (BID) | INTRAVENOUS | Status: DC

## 2022-08-31 MED ORDER — LIDOCAINE HCL (PF) 1 % IJ SOLN
INTRAMUSCULAR | Status: DC | PRN
  Administered 2022-08-31: 2 mL

## 2022-08-31 MED ORDER — LABETALOL HCL 5 MG/ML IV SOLN: 10.0000 mg | INTRAVENOUS | Status: DC | PRN

## 2022-08-31 MED ORDER — FENTANYL CITRATE (PF) 100 MCG/2ML IJ SOLN
INTRAMUSCULAR | Status: AC
  Filled 2022-08-31: qty 2

## 2022-08-31 MED ORDER — HEPARIN (PORCINE) IN NACL 1000-0.9 UT/500ML-% IV SOLN
INTRAVENOUS | Status: DC | PRN
  Administered 2022-08-31: 500 mL

## 2022-08-31 MED ORDER — SODIUM CHLORIDE 0.9% FLUSH: 3.0000 mL | INTRAVENOUS | Status: DC | PRN

## 2022-08-31 MED ORDER — ONDANSETRON HCL 4 MG/2ML IJ SOLN: 4.0000 mg | Freq: Four times a day (QID) | INTRAMUSCULAR | Status: DC | PRN

## 2022-08-31 SURGICAL SUPPLY — 5 items
CATH BALLN WEDGE 5F 110CM (CATHETERS) IMPLANT
GUIDEWIRE .025 260CM (WIRE) IMPLANT
PACK CARDIAC CATHETERIZATION (CUSTOM PROCEDURE TRAY) IMPLANT
SHEATH GLIDE SLENDER 4/5FR (SHEATH) IMPLANT
TRANSDUCER W/STOPCOCK (MISCELLANEOUS) IMPLANT

## 2022-08-31 NOTE — Discharge Instructions (Signed)

## 2022-08-31 NOTE — Progress Notes (Signed)
Right heart cath confirms evidence of pulmonary hypertension. Can we order a VQ scan for pulmonary hypertension and also please refer him to heart failure clinic for pulmonary hypertension  Dominga Ferry MD

## 2022-08-31 NOTE — Interval H&P Note (Signed)
History and Physical Interval Note:  08/31/2022 10:11 AM  Leon Dennis  has presented today for surgery, with the diagnosis of hp.  The various methods of treatment have been discussed with the patient and family. After consideration of risks, benefits and other options for treatment, the patient has consented to  Procedure(s): RIGHT HEART CATH (N/A) as a surgical intervention.  The patient's history has been reviewed, patient examined, no change in status, stable for surgery.  I have reviewed the patient's chart and labs.  Questions were answered to the patient's satisfaction.     Verne Carrow

## 2022-09-01 ENCOUNTER — Encounter: Payer: Self-pay | Admitting: *Deleted

## 2022-09-01 ENCOUNTER — Encounter (HOSPITAL_COMMUNITY): Payer: Self-pay | Admitting: Cardiovascular Disease

## 2022-09-01 LAB — POCT I-STAT EG7
Acid-Base Excess: 3 mmol/L — ABNORMAL HIGH (ref 0.0–2.0)
Bicarbonate: 27.5 mmol/L (ref 20.0–28.0)
Calcium, Ion: 1.19 mmol/L (ref 1.15–1.40)
HCT: 46 % (ref 39.0–52.0)
Hemoglobin: 15.6 g/dL (ref 13.0–17.0)
O2 Saturation: 67 %
Potassium: 3.3 mmol/L — ABNORMAL LOW (ref 3.5–5.1)
Sodium: 138 mmol/L (ref 135–145)
TCO2: 29 mmol/L (ref 22–32)
pCO2, Ven: 40.8 mmHg — ABNORMAL LOW (ref 44–60)
pH, Ven: 7.436 — ABNORMAL HIGH (ref 7.25–7.43)
pO2, Ven: 34 mmHg (ref 32–45)

## 2022-09-01 NOTE — Progress Notes (Signed)
Patient notified via mychart

## 2022-09-05 ENCOUNTER — Encounter: Payer: Self-pay | Admitting: *Deleted

## 2022-09-05 ENCOUNTER — Other Ambulatory Visit: Payer: Self-pay | Admitting: *Deleted

## 2022-09-05 DIAGNOSIS — I2721 Secondary pulmonary arterial hypertension: Secondary | ICD-10-CM

## 2022-09-08 ENCOUNTER — Ambulatory Visit (HOSPITAL_COMMUNITY)
Admission: RE | Admit: 2022-09-08 | Discharge: 2022-09-08 | Disposition: A | Discharge status: Home / Self Care | Payer: Medicare Other | Source: Ambulatory Visit | Attending: Cardiology | Admitting: Cardiology

## 2022-09-08 ENCOUNTER — Other Ambulatory Visit: Payer: Self-pay | Admitting: Cardiology

## 2022-09-08 ENCOUNTER — Encounter (HOSPITAL_COMMUNITY)
Admission: RE | Admit: 2022-09-08 | Discharge: 2022-09-08 | Disposition: A | Discharge status: Home / Self Care | Payer: Medicare Other | Source: Ambulatory Visit | Attending: Cardiology | Admitting: Cardiology

## 2022-09-08 DIAGNOSIS — I2721 Secondary pulmonary arterial hypertension: Secondary | ICD-10-CM

## 2022-09-08 MED ORDER — TECHNETIUM TO 99M ALBUMIN AGGREGATED
4.0000 | Freq: Once | INTRAVENOUS | Status: AC | PRN
  Administered 2022-09-08: 4 via INTRAVENOUS

## 2022-09-11 ENCOUNTER — Encounter: Payer: Self-pay | Admitting: *Deleted

## 2022-09-19 ENCOUNTER — Ambulatory Visit: Payer: Medicare Other | Attending: Cardiology

## 2022-09-19 DIAGNOSIS — I272 Pulmonary hypertension, unspecified: Secondary | ICD-10-CM | POA: Insufficient documentation

## 2022-09-20 LAB — ECHOCARDIOGRAM COMPLETE
AR max vel: 1.81 cm2
AV Area VTI: 1.83 cm2
AV Area mean vel: 1.68 cm2
AV Mean grad: 4 mmHg
AV Peak grad: 6.5 mmHg
Ao pk vel: 1.28 m/s
Area-P 1/2: 4.86 cm2
Calc EF: 62.9 %
MV M vel: 4.46 m/s
MV Peak grad: 79.4 mmHg
S' Lateral: 2.2 cm
Single Plane A2C EF: 55.7 %
Single Plane A4C EF: 69.1 %

## 2022-09-27 ENCOUNTER — Encounter: Payer: Self-pay | Admitting: Nurse Practitioner

## 2022-09-27 ENCOUNTER — Ambulatory Visit: Payer: Medicare Other | Attending: Nurse Practitioner | Admitting: Nurse Practitioner

## 2022-09-27 VITALS — BP 124/80 | HR 74 | Ht 68.0 in | Wt 203.8 lb

## 2022-09-27 DIAGNOSIS — N1831 Chronic kidney disease, stage 3a: Secondary | ICD-10-CM | POA: Diagnosis present

## 2022-09-27 DIAGNOSIS — G4733 Obstructive sleep apnea (adult) (pediatric): Secondary | ICD-10-CM | POA: Diagnosis present

## 2022-09-27 DIAGNOSIS — I4891 Unspecified atrial fibrillation: Secondary | ICD-10-CM | POA: Diagnosis not present

## 2022-09-27 DIAGNOSIS — I50812 Chronic right heart failure: Secondary | ICD-10-CM | POA: Diagnosis not present

## 2022-09-27 DIAGNOSIS — I5032 Chronic diastolic (congestive) heart failure: Secondary | ICD-10-CM | POA: Diagnosis present

## 2022-09-27 DIAGNOSIS — I272 Pulmonary hypertension, unspecified: Secondary | ICD-10-CM | POA: Insufficient documentation

## 2022-09-27 NOTE — Patient Instructions (Addendum)
Medication Instructions:   Continue all current medications.   Labwork:  none  Testing/Procedures:  none  Follow-Up:  6 - 8 weeks   Any Other Special Instructions Will Be Listed Below (If Applicable).  Already referred to CHF clinic   If you need a refill on your cardiac medications before your next appointment, please call your pharmacy.

## 2022-09-27 NOTE — Progress Notes (Signed)
Office Visit    Patient Name: Leon Dennis Date of Encounter: 09/27/2022  PCP:  Majel Homer, MD   [facility]  Cardiologist:  Dina Rich, MD *** Advanced Practice Provider:  No care team member to display Electrophysiologist:  None  {Press F2 to show EP APP, CHF, sleep or structural heart MD               :[id number]}  { Click here to update then REFRESH NOTE - MD (PCP) or APP (Team Member)  Change PCP Type for MD, Specialty for APP is either Cardiology or Clinical Cardiac Electrophysiology  :[id number]}  Chief Complaint    Leon Dennis is a 77 y.o. male with a hx of chronic right-sided heart failure/HFpEF, A-fib, OSA, history of foot ulcer, CKD stage IIIa (follows nephrology), and pulmonary hypertension, who presents today for postcardiac catheterization follow-up.  Past Medical History    Past Medical History:  Diagnosis Date   CHF (congestive heart failure) (HCC)    Diabetes mellitus without complication (HCC)    Hypertension    Kidney failure    Past Surgical History:  Procedure Laterality Date   CARDIOVERSION N/A 08/22/2021   Procedure: CARDIOVERSION;  Surgeon: Meriam Sprague, MD;  Location: [facility] ENDOSCOPY;  Service: Cardiovascular;  Laterality: N/A;   RIGHT HEART CATH N/A 08/31/2022   Procedure: RIGHT HEART CATH;  Surgeon: Kathleene Hazel, MD;  Location: [facility] INVASIVE CV LAB;  Service: Cardiovascular;  Laterality: N/A;    Allergies  No Known Allergies  History of Present Illness    Leon Dennis is a 77 y.o. male with a MH as mentioned above.  Newly diagnosed with A-fib in February 2023 at visit with PCP.  Had unsuccessful cardioversion in April 2023.  Echocardiogram April 2023 revealed EF 55 to 60%, indeterminate diastolic function, mild to moderate RV dysfunction, moderate RV enlargement, moderate pulmonary hypertension with PASP at 51 mmHg, severe LAE.  He turned down right heart cath.  PFTs arranged in June 2024  revealed some evidence of obstructive lung disease.  Sleep study revealed severe OSA.  Last seen by Dr. Dina Rich on August 22, 2022.  At that, he noted recent weight gain and abdominal distention.  Weight was started to trend down after increasing torsemide to 60 mg twice daily.    Underwent right heart catheterization on Aug 31, 2022 that revealed moderate pulmonary hypertension with mean PA pressure at 40 mmHg. Then, echocardiogram was repeated and revealed EF 60 to 65%, no RWMA, indeterminate diastolic parameters, moderate enlarged RV size, mildly reduced RV systolic function, moderately elevated PASP, mild MR. VQ scan was negative for PE.  2 view chest x-ray was negative for anything acute.  Today he presents for postcardiac catheterization follow-up.  He states  EKGs/Labs/Other Studies Reviewed:   The following studies were reviewed today: ***  EKG:  EKG is *** ordered today.  The ekg ordered today demonstrates ***  Recent Labs: 08/28/2022: B Natriuretic Peptide 801.0; BUN 37; Creatinine, Ser 1.77; Magnesium 2.6; Platelets 200 08/31/2022: Hemoglobin 15.6; Hemoglobin 15.6; Potassium 3.3; Potassium 3.3; Sodium 139; Sodium 138  Recent Lipid Panel    Component Value Date/Time   CHOL 109 08/19/2021 0701   TRIG 50 08/19/2021 0701   HDL 47 08/19/2021 0701   CHOLHDL 2.3 08/19/2021 0701   VLDL 10 08/19/2021 0701   LDLCALC 52 08/19/2021 0701    Risk Assessment/Calculations:  {Does this patient have ATRIAL FIBRILLATION?:[phone]}  Home Medications   Current Meds  Medication  Sig   acetaminophen (TYLENOL) 500 MG tablet Take 1,000 mg by mouth every 4 (four) hours as needed for mild pain.   amLODipine (NORVASC) 10 MG tablet Take 10 mg by mouth daily.   apixaban (ELIQUIS) 5 MG TABS tablet Take 1 tablet (5 mg total) by mouth 2 (two) times daily.   calcitRIOL (ROCALTROL) 0.25 MCG capsule Take 0.25 mcg by mouth 2 (two) times a week. Monday & Friday   FARXIGA 10 MG TABS tablet Take 10 mg  by mouth daily.   Finerenone 10 MG TABS Take 1 tablet by mouth daily at 6 (six) AM. Leon Dennis   glimepiride (AMARYL) 4 MG tablet Take 4 mg by mouth in the morning and at bedtime.   hydrALAZINE (APRESOLINE) 25 MG tablet Take 25 mg by mouth 2 (two) times daily.   lisinopril (ZESTRIL) 40 MG tablet Take 40 mg by mouth daily.   metoprolol tartrate (LOPRESSOR) 100 MG tablet Take 100 mg by mouth 2 (two) times daily.   Multiple Vitamin (MULTIVITAMIN) tablet Take 1 tablet by mouth daily.   pioglitazone (ACTOS) 30 MG tablet Take 30 mg by mouth daily.   Polyvinyl Alcohol-Povidone (REFRESH OP) Place 1 drop into both eyes daily as needed (irritation).   potassium chloride SA (KLOR-CON M) 20 MEQ tablet Take 20 mEq by mouth daily.   torsemide (DEMADEX) 20 MG tablet Take 3 tablets (60 mg total) by mouth 2 (two) times daily.   Current Facility-Administered Medications for the 09/27/22 encounter (Office Visit) with Sharlene Dory, NP  Medication   sodium chloride flush (NS) 0.9 % injection 3 mL     Review of Systems   ***   All other systems reviewed and are otherwise negative except as noted above.  Physical Exam    VS:  BP 124/80   Pulse 74   Ht 5\' 8"  (1.727 m)   Wt 203 lb 12.8 oz (92.4 kg)   SpO2 94%   BMI 30.99 kg/m  , BMI Body mass index is 30.99 kg/m.  Wt Readings from Last 3 Encounters:  09/27/22 203 lb 12.8 oz (92.4 kg)  08/31/22 198 lb (89.8 kg)  08/22/22 208 lb 3.2 oz (94.4 kg)     GEN: Well nourished, well developed, in no acute distress. HEENT: normal. Neck: Supple, no JVD, carotid bruits, or masses. Cardiac: ***RRR, no murmurs, rubs, or gallops. No clubbing, cyanosis, edema.  ***Radials/PT 2+ and equal bilaterally.  Respiratory:  ***Respirations regular and unlabored, clear to auscultation bilaterally. GI: Soft, nontender, nondistended. MS: No deformity or atrophy. Skin: Warm and dry, no rash. Neuro:  Strength and sensation are intact. Psych: Normal affect.  Assessment &  Plan    ***  {Are you ordering a CV Procedure (e.g. stress test, cath, DCCV, TEE, etc)?   Press F2        :[id number]}      Disposition: Follow up {follow up:15908} with Dina Rich, MD or APP.  Signed, Sharlene Dory, NP 09/27/2022, 3:17 PM [facility]

## 2022-09-28 ENCOUNTER — Ambulatory Visit: Payer: PRIVATE HEALTH INSURANCE | Admitting: Nurse Practitioner

## 2022-10-09 ENCOUNTER — Telehealth: Payer: Self-pay | Admitting: *Deleted

## 2022-10-09 NOTE — Telephone Encounter (Signed)
Lesle Chris, LPN 1/61/0960 45:40 AM EDT Back to Top    Notified, copy to pcp.

## 2022-10-09 NOTE — Telephone Encounter (Signed)
-----   Message from Antoine Poche, MD sent at 10/02/2022  9:55 AM EDT ----- Right side of the heart remains moderately weakened, no additional changes. I don't see an appt with heart failure clinic has been scheduled, he needs to schedule this ASAP, if needs new referral we can place.   Dominga Ferry MD

## 2022-10-31 ENCOUNTER — Encounter (HOSPITAL_COMMUNITY): Payer: Self-pay | Admitting: Cardiology

## 2022-10-31 ENCOUNTER — Ambulatory Visit (HOSPITAL_COMMUNITY)
Admission: RE | Admit: 2022-10-31 | Discharge: 2022-10-31 | Disposition: A | Discharge status: Home / Self Care | Payer: Medicare Other | Source: Ambulatory Visit | Attending: Cardiology | Admitting: Cardiology

## 2022-10-31 VITALS — BP 110/60 | HR 64 | Wt 203.4 lb

## 2022-10-31 DIAGNOSIS — Z7984 Long term (current) use of oral hypoglycemic drugs: Secondary | ICD-10-CM | POA: Diagnosis not present

## 2022-10-31 DIAGNOSIS — R0602 Shortness of breath: Secondary | ICD-10-CM | POA: Insufficient documentation

## 2022-10-31 DIAGNOSIS — N1832 Chronic kidney disease, stage 3b: Secondary | ICD-10-CM | POA: Insufficient documentation

## 2022-10-31 DIAGNOSIS — I4891 Unspecified atrial fibrillation: Secondary | ICD-10-CM

## 2022-10-31 DIAGNOSIS — I4819 Other persistent atrial fibrillation: Secondary | ICD-10-CM | POA: Insufficient documentation

## 2022-10-31 DIAGNOSIS — J449 Chronic obstructive pulmonary disease, unspecified: Secondary | ICD-10-CM | POA: Diagnosis not present

## 2022-10-31 DIAGNOSIS — N1831 Chronic kidney disease, stage 3a: Secondary | ICD-10-CM

## 2022-10-31 DIAGNOSIS — E1122 Type 2 diabetes mellitus with diabetic chronic kidney disease: Secondary | ICD-10-CM | POA: Diagnosis not present

## 2022-10-31 DIAGNOSIS — I272 Pulmonary hypertension, unspecified: Secondary | ICD-10-CM | POA: Diagnosis not present

## 2022-10-31 DIAGNOSIS — I130 Hypertensive heart and chronic kidney disease with heart failure and stage 1 through stage 4 chronic kidney disease, or unspecified chronic kidney disease: Secondary | ICD-10-CM | POA: Diagnosis not present

## 2022-10-31 DIAGNOSIS — I50812 Chronic right heart failure: Secondary | ICD-10-CM

## 2022-10-31 DIAGNOSIS — Z79899 Other long term (current) drug therapy: Secondary | ICD-10-CM | POA: Insufficient documentation

## 2022-10-31 DIAGNOSIS — I2721 Secondary pulmonary arterial hypertension: Secondary | ICD-10-CM | POA: Diagnosis not present

## 2022-10-31 DIAGNOSIS — R5383 Other fatigue: Secondary | ICD-10-CM | POA: Diagnosis not present

## 2022-10-31 LAB — BASIC METABOLIC PANEL
Anion gap: 11 (ref 5–15)
BUN: 39 mg/dL — ABNORMAL HIGH (ref 8–23)
CO2: 24 mmol/L (ref 22–32)
Calcium: 9.1 mg/dL (ref 8.9–10.3)
Chloride: 99 mmol/L (ref 98–111)
Creatinine, Ser: 1.88 mg/dL — ABNORMAL HIGH (ref 0.61–1.24)
GFR, Estimated: 36 mL/min — ABNORMAL LOW (ref >60)
Glucose, Bld: 269 mg/dL — ABNORMAL HIGH (ref 70–99)
Potassium: 3.9 mmol/L (ref 3.5–5.1)
Sodium: 134 mmol/L — ABNORMAL LOW (ref 135–145)

## 2022-10-31 LAB — CBC
HCT: 48.5 % (ref 39.0–52.0)
Hemoglobin: 16 g/dL (ref 13.0–17.0)
MCH: 29.6 pg (ref 26.0–34.0)
MCHC: 33 g/dL (ref 30.0–36.0)
MCV: 89.6 fL (ref 80.0–100.0)
Platelets: 190 10*3/uL (ref 150–400)
RBC: 5.41 MIL/uL (ref 4.22–5.81)
RDW: 15.1 % (ref 11.5–15.5)
WBC: 6.1 10*3/uL (ref 4.0–10.5)
nRBC: 0 % (ref 0.0–0.2)

## 2022-10-31 LAB — HEPATIC FUNCTION PANEL
ALT: 13 U/L (ref 0–44)
AST: 25 U/L (ref 15–41)
Albumin: 4 g/dL (ref 3.5–5.0)
Alkaline Phosphatase: 78 U/L (ref 38–126)
Bilirubin, Direct: 0.3 mg/dL — ABNORMAL HIGH (ref 0.0–0.2)
Indirect Bilirubin: 1 mg/dL — ABNORMAL HIGH (ref 0.3–0.9)
Total Bilirubin: 1.3 mg/dL — ABNORMAL HIGH (ref 0.3–1.2)
Total Protein: 7.8 g/dL (ref 6.5–8.1)

## 2022-10-31 MED ORDER — BISOPROLOL FUMARATE 5 MG PO TABS: 5.0000 mg | ORAL_TABLET | Freq: Every day | ORAL | 3 refills | Status: DC

## 2022-10-31 NOTE — Progress Notes (Signed)
6 Min Walk Test Completed  Pt ambulated 1400 ft (426.7 m) O2 Sat ranged 95%-90% on room air HR ranged 84-130

## 2022-10-31 NOTE — Patient Instructions (Addendum)
Medication Changes:  STOP METOPROLOL   START: BISOPROLOL 5mg  ONCE DAILY   Lab Work:  Labs done today, your results will be available in MyChart, we will contact you for abnormal readings.   Testing/Procedures:  HIGH RESOLUTION CHEST CT- SOMEONE WILL REACH OUT TO YOU TO GET THIS SCHEDULED   Referrals:  REFERRAL TO PULMONOLOGY- SOMEONE WILL REACH OUT TO GET YOU SCHEDULED FOR THIS  Follow-Up in: 2 MONTHS  At the [facility], you and your health needs are our priority. We have a designated team specialized in the treatment of Heart Failure. This Care Team includes your primary Heart Failure Specialized Cardiologist (physician), Advanced Practice Providers (APPs- Physician Assistants and Nurse Practitioners), and Pharmacist who all work together to provide you with the care you need, when you need it.   You may see any of the following providers on your designated Care Team at your next follow up:  Dr. Arvilla Meres Dr. Marca Ancona Dr. Marcos Eke, NP Robbie Lis, [place] [facility] [place], [place] Brynda Peon, NP Karle Plumber, PharmD   Please be sure to bring in all your medications bottles to every appointment.   Need to Contact us:  If you have any questions or concerns before your next appointment please send [place] a message through [place] or call our office at [phone].    TO LEAVE A MESSAGE FOR THE NURSE SELECT OPTION 2, PLEASE LEAVE A MESSAGE INCLUDING: YOUR NAME DATE OF BIRTH CALL BACK NUMBER REASON FOR CALL**this is important as we prioritize the call backs  YOU WILL RECEIVE A CALL BACK THE SAME DAY AS LONG AS YOU CALL BEFORE 4:00 PM

## 2022-10-31 NOTE — Progress Notes (Signed)
ReDS Vest / Clip - 10/31/22 1200       ReDS Vest / Clip   Station Marker C    Ruler Value 31    ReDS Value Range Low volume    ReDS Actual Value 32

## 2022-10-31 NOTE — Progress Notes (Addendum)
ADVANCED HEART FAILURE CLINIC NOTE  Referring Physician: Majel Homer, MD  Primary Care: Leon Homer, MD Primary Cardiologist: Dr. Wyline Mood HF: Dr. Gasper Lloyd  HPI: Leon Dennis is a 77 y.o. male with CKD 3A, type 2 diabetes, persistent atrial fibrillation and RV dysfunction presenting today for evaluation of pulmonary hypertension.Leon Dennis cardiac history dates back to roughly 1 year. He was becomingly fatigued and SOB last year with minimal exertion. He was diagnosed with atrial fibrillation. Shortly after diagnosis he was admitted with volume overload; failed x3 DCCV during admission. Due to persistent SOB, abdominal distention, dilated RV on TTE and LE edema he had a RHC which was consistent with pulmonary hypertension.  He reports no family history of pulmonary hypertension, connective tissue disease.  No reported history of smoking however did have significant secondhand exposure from his parents and wife.  Functionally, he can perform all ADLs without difficulty but becomes fatigued and mildly short of breath with moderate to excessive exertion.   Past Medical History:  Diagnosis Date   CHF (congestive heart failure) (HCC)    Diabetes mellitus without complication (HCC)    Hypertension    Kidney failure     Current Outpatient Medications  Medication Sig Dispense Refill   acetaminophen (TYLENOL) 500 MG tablet Take 1,000 mg by mouth every 4 (four) hours as needed for mild pain.     amLODipine (NORVASC) 10 MG tablet Take 10 mg by mouth daily.     apixaban (ELIQUIS) 5 MG TABS tablet Take 1 tablet (5 mg total) by mouth 2 (two) times daily. 28 tablet 0   calcitRIOL (ROCALTROL) 0.25 MCG capsule Take 0.25 mcg by mouth 2 (two) times a week. Monday & Friday     FARXIGA 10 MG TABS tablet Take 10 mg by mouth daily.     Finerenone 10 MG TABS Take 1 tablet by mouth daily at 6 (six) AM. [name]     glimepiride (AMARYL) 4 MG tablet Take 4 mg by mouth in the morning and at bedtime.      hydrALAZINE (APRESOLINE) 25 MG tablet Take 25 mg by mouth 2 (two) times daily.     lisinopril (ZESTRIL) 40 MG tablet Take 40 mg by mouth daily.     metoprolol tartrate (LOPRESSOR) 100 MG tablet Take 100 mg by mouth 2 (two) times daily.     Multiple Vitamin (MULTIVITAMIN) tablet Take 1 tablet by mouth daily.     pioglitazone (ACTOS) 30 MG tablet Take 30 mg by mouth daily.     Polyvinyl Alcohol-Povidone (REFRESH OP) Place 1 drop into both eyes daily as needed (irritation).     potassium chloride SA (KLOR-CON M) 20 MEQ tablet Take 20 mEq by mouth daily.     torsemide (DEMADEX) 20 MG tablet Take 3 tablets (60 mg total) by mouth 2 (two) times daily. 540 tablet 3   Current Facility-Administered Medications  Medication Dose Route Frequency Provider Last Rate Last Admin   sodium chloride flush (NS) 0.9 % injection 3 mL  3 mL Intravenous Q12H Branch, Dorothe Pea, MD        No Known Allergies    Social History   Socioeconomic History   Marital status: Married    Spouse name: Not on file   Number of children: Not on file   Years of education: Not on file   Highest education level: Not on file  Occupational History   Not on file  Tobacco Use   Smoking status: Never   Smokeless tobacco: Never  Substance and Sexual Activity   Alcohol use: Yes    Comment: [email or web address].wine   Drug use: Never   Sexual activity: Not on file  Other Topics Concern   Not on file  Social History Narrative   Not on file   Social Determinants of Health   Financial Resource Strain: Not on file  Food Insecurity: Not on file  Transportation Needs: Not on file  Physical Activity: Not on file  Stress: Not on file  Social Connections: Not on file  Intimate Partner Violence: Not on file     History reviewed. No pertinent family history.  PHYSICAL EXAM: Vitals:   10/31/22 1134  BP: 110/60  Pulse: 64  SpO2: 94%   GENERAL: Well nourished, well developed, and in no apparent distress at rest.  HEENT: Negative for  arcus senilis or xanthelasma. There is no scleral icterus.  The mucous membranes are pink and moist.   NECK: Supple, No masses. Normal carotid upstrokes without bruits. No masses or thyromegaly.    CHEST: There are no chest wall deformities. There is no chest wall tenderness. Respirations are unlabored.  Lungs- CTA B/L CARDIAC:  JVP: 7 cm H2O         Normal S1, S2  Normal rate with regular rhythm. No murmurs, rubs or gallops.  Pulses are 2+ and symmetrical in upper and lower extremities. No edema.  ABDOMEN: Soft, non-tender, non-distended. There are no masses or hepatomegaly. There are normal bowel sounds.  EXTREMITIES: Warm and well perfused with no cyanosis, clubbing.  LYMPHATIC: No axillary or supraclavicular lymphadenopathy.  NEUROLOGIC: Patient is oriented x3 with no focal or lateralizing neurologic deficits.  PSYCH: Patients affect is appropriate, there is no evidence of anxiety or depression.  SKIN: Warm and dry; no lesions or wounds.   DATA REVIEW  ECG: 10/31/22: Atrial fibrillation  As per my personal interpretation  ECHO: 09/19/2022: Normal LV function, mildly dilated RV with mildly reduced function as per my personal interpretation  CATH: RA 12, RV 60/13/14, PA 66/18 mean 40, PCWP 13 CO 5.28 L/min CI 2.59  PVR 5.12 Personally reviewed waveforms  :  10/31/22:  Pt ambulated 1400 ft (426.7 m) O2 Sat ranged 95%-90% on room air HR ranged 84-130   ASSESSMENT & PLAN:  Pulmonary hypertension -Right heart cath as noted above with PVR of 5 and preserved cardiac index.  Papi is also preserved.  Echocardiogram with mild to moderately dilated RV with relatively normal function. -PFT from 1/24 consistent with emphysema/severe obstructive lung disease.  Will obtain high-res CT for further evaluation.  Outpatient pulmonology referral to start inhalers and aggressive management of obstructive lung disease. -Negative VQ scan from 09/08/2022 -Plan for sleep study -CBC, ANA, hepatic  function panel, BMP today - REDS 31% today.  - D/C metoprolol; start bisoprolol 5mg . Will uptitrate to 10mg  at follow up.  - Currently taking torsemide 60mg  daily; will continue.  -He likely has group 3 pulmonary hypertension with possibly some component of group 2, however, PCWP of 13 on right heart catheterization.  Could have elevated left atrial pressures from prolonged atrial fibrillation.  After evaluation by pulmonology and better control of underlying COPD will consider starting amiodarone or other rhythm control medications followed by cardioversion to avoid long-term sequela of permanent atrial fibrillation.  2.  Atrial fibrillation -DCCV x 3 previously which failed.  Will likely require antiarrhythmics.  Will plan for EP evaluation after pulmonology.  3.  CKD 3B -Baseline serum creatinine 1.3-1.8. -Repeat labs today. -Finerenone 10mg  daily,  farxiga 10mg  daily.    Yenesis Even Advanced Heart Failure Mechanical Circulatory Support

## 2022-11-01 ENCOUNTER — Ambulatory Visit: Payer: Medicare Other | Attending: Nurse Practitioner | Admitting: Nurse Practitioner

## 2022-11-01 ENCOUNTER — Encounter: Payer: Self-pay | Admitting: Nurse Practitioner

## 2022-11-01 VITALS — BP 100/50 | HR 70 | Ht 68.5 in | Wt 202.2 lb

## 2022-11-01 DIAGNOSIS — I272 Pulmonary hypertension, unspecified: Secondary | ICD-10-CM | POA: Insufficient documentation

## 2022-11-01 DIAGNOSIS — I5032 Chronic diastolic (congestive) heart failure: Secondary | ICD-10-CM

## 2022-11-01 DIAGNOSIS — I50812 Chronic right heart failure: Secondary | ICD-10-CM | POA: Diagnosis not present

## 2022-11-01 DIAGNOSIS — I4891 Unspecified atrial fibrillation: Secondary | ICD-10-CM

## 2022-11-01 DIAGNOSIS — N1832 Chronic kidney disease, stage 3b: Secondary | ICD-10-CM | POA: Diagnosis present

## 2022-11-01 DIAGNOSIS — G4733 Obstructive sleep apnea (adult) (pediatric): Secondary | ICD-10-CM | POA: Diagnosis present

## 2022-11-01 LAB — ANA W/REFLEX: Anti Nuclear Antibody (ANA): NEGATIVE

## 2022-11-01 NOTE — Progress Notes (Signed)
Cardiology Office Note:  .   Date:  11/01/2022  ID:  [name], DOB 1945/09/17, MRN [id number] PCP: Majel Homer, MD  [facility] Providers Cardiologist:  Dina Rich, MD    History of Present Illness: .   Leon Dennis is a 77 y.o. male a PMH of chronic right-sided CHF/HFpEF, A-fib, OSA, emphysema, hx of foot ulcer, CKD stage 3b (follows Nephrology), and pulmonary HTN, who presents today for follow-up.   Last saw patient on 09/27/2022 post right cardiac catheterization- see results below. Was doing much better after procedure. Saw HF clinic yesterday, metoprolol switched to bisoprolol. Referred to Pulmonology.  Today he presents for follow-up. He states he is doing well. Denies any chest pain, shortness of breath, palpitations, syncope, presyncope, dizziness, orthopnea, PND, swelling or significant weight changes, acute bleeding, or claudication.  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 Stays very active.  Studies Reviewed: .   Echo 09/20/2022:  1. Left ventricular ejection fraction, by estimation, is 60 to 65%. The  left ventricle has normal function. The left ventricle has no regional  wall motion abnormalities. Left ventricular diastolic parameters are  indeterminate.   2. Right ventricular systolic function is moderately reduced. The right  ventricular size is moderately enlarged. There is moderately elevated  pulmonary artery systolic pressure.   3. Right atrial size was mildly dilated.   4. The mitral valve is abnormal. Mild mitral valve regurgitation. No  evidence of mitral stenosis.   5. The tricuspid valve is abnormal.   6. The aortic valve is tricuspid. Aortic valve regurgitation is not  visualized. No aortic stenosis is present.   7. The inferior vena cava is normal in size with greater than 50%  respiratory variability, suggesting right atrial pressure of 3 mmHg.   Comparison(s):  Echocardiogram done 08/18/21 showed an EF of 55-60%.   RHC 08/31/2022: Moderate pulmonary HTN (mean PA 40 mmHg) with PVR 5.12   RA 12, RV 60/13/14, PA 66/18 mean 40, PCWP 13 CO 5.28 L/min CI 2.59  PVR 5.12  Risk Assessment/Calculations:    CHA2DS2-VASc Score = 3   This indicates a 3.2% annual risk of stroke. The patient's score is based upon: CHF History: 1 HTN History: 0 Diabetes History: 0 Stroke History: 0 Vascular Disease History: 0 Age Score: 2 Gender Score: 0  The 10-year ASCVD risk score (Arnett DK, et al., 2019) is: 36.3%   Values used to calculate the score:     Age: 43 years     Sex: Male     Is Non-Hispanic African American: No     Diabetic: Yes     Tobacco smoker: No     Systolic Blood Pressure: 100 mmHg     Is BP treated: Yes     HDL Cholesterol: 46 MG/DL     Total Cholesterol: 154 MG/DL  Physical Exam:   VS:  BP (!) 100/50   Pulse 70   Ht 5' 8.5" (1.74 m)   Wt 202 lb 3.2 oz (91.7 kg)   SpO2 94%   BMI 30.30 kg/m    Wt Readings from Last 3 Encounters:  11/01/22 202 lb 3.2 oz (91.7 kg)  10/31/22 203 lb 6.4 oz (92.3 kg)  09/27/22 203 lb 12.8 oz (92.4 kg)    GEN: Well nourished, well developed in no acute distress NECK: No JVD; No carotid bruits CARDIAC: S1/S2, irregular rhythm and regular rate, no murmurs, rubs,  gallops RESPIRATORY:  Clear to auscultation without rales, wheezing or rhonchi  ABDOMEN: Soft, non-tender, non-distended EXTREMITIES:  No edema; No deformity   ASSESSMENT AND PLAN: .    Chronic right sided HF/ HFpEF Stage C, NYHA class I-II symptoms. Euvolemic and well compensated on exam. Continue Farxiga, hydralazine, lisinopril, bisoprolol, Torsemide, and potassium supplementation. Low sodium diet, fluid restriction <2L, and daily weights encouraged. Educated to contact our office for weight gain of 2 lbs overnight or 5 lbs in one week. Follow-up with HF clinic as scheduled.   Pulmonary HTN RHC showed moderate pulmonary hypertension. Most  likely d/t right sided HF/OSA/ and most likely emphysema, Group 3 and possible component of Group 2. Denies any worsening symptoms. Continue current medication regimen at this time. Follow-up with HF clinic as mentioned above.  Heart healthy diet and regular cardiovascular exercise encouraged.   A-fib Hx of unsuccessful DCCV in 2023. Denies any tachycardia or palpitations. HR well controlled today. Continue current medication regimen. Continue Eliquis 5 mg BID, on appropriate dosage. Denies any bleeding issues. Heart healthy diet and regular cardiovascular exercise encouraged.   4.OSA Recently referred to Pulmonology. Follow-up with Pulmonology as scheduled   5. CKD stage 3b Recent labs revealed stable kidney disease. No medication changes at this time. Avoid nephrotoxic agents. Continue to follow-up with Nephrology.   Dispo: Follow-up with me or APP in 4-6 months or sooner if anything changes.   Signed, Sharlene Dory, NP

## 2022-11-01 NOTE — Patient Instructions (Addendum)
Medication Instructions:  Your physician recommends that you continue on your current medications as directed. Please refer to the Current Medication list given to you today.  Labwork: none  Testing/Procedures: none  Follow-Up: Your physician recommends that you schedule a follow-up appointment in: 4-6 Months with Philis Nettle  Any Other Special Instructions Will Be Listed Below (If Applicable).  If you need a refill on your cardiac medications before your next appointment, please call your pharmacy.

## 2022-11-16 ENCOUNTER — Encounter (HOSPITAL_COMMUNITY): Payer: Self-pay | Admitting: Cardiology

## 2022-11-21 ENCOUNTER — Ambulatory Visit (INDEPENDENT_AMBULATORY_CARE_PROVIDER_SITE_OTHER): Payer: Medicare Other | Admitting: Podiatry

## 2022-11-21 ENCOUNTER — Encounter: Payer: Self-pay | Admitting: Podiatry

## 2022-11-21 DIAGNOSIS — B351 Tinea unguium: Secondary | ICD-10-CM

## 2022-11-21 DIAGNOSIS — E1159 Type 2 diabetes mellitus with other circulatory complications: Secondary | ICD-10-CM

## 2022-11-21 DIAGNOSIS — M79674 Pain in right toe(s): Secondary | ICD-10-CM

## 2022-11-21 DIAGNOSIS — I739 Peripheral vascular disease, unspecified: Secondary | ICD-10-CM | POA: Diagnosis not present

## 2022-11-21 DIAGNOSIS — M79675 Pain in left toe(s): Secondary | ICD-10-CM

## 2022-11-21 NOTE — Progress Notes (Signed)
This patient presents to the office with chief complaint of long thick nails and diabetic feet.  This patient  says there  is  no pain and discomfort in his  feet.  This patient says there are long thick painful nails.  These nails are painful walking and wearing shoes.    Patient is unable to  self treat his own nails . 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  This patient presents  to the office today for treatment of the  long nails   General Appearance  Alert, conversant and in no acute stress.  Vascular  Dorsalis pedis and posterior tibial  pulses are absent  bilaterally.  Capillary return is within normal limits  bilaterally. Temperature is within normal limits  bilaterally.  Neurologic  Senn-Weinstein monofilament wire test within normal limits  bilaterally. Muscle power within normal limits bilaterally.  Nails Thick disfigured discolored nails with subungual debris  from hallux to fifth toes bilaterally. No evidence of bacterial infection or drainage bilaterally.  Orthopedic  No limitations of motion of motion feet .  No crepitus or effusions noted.  No bony pathology or digital deformities noted.  Hallux limitus 1st MPJ  B/L.  Skin  normotropic skin with no porokeratosis noted bilaterally.  No signs of infections or ulcers noted.     Onychomycosis  Diabetes with no foot complications  Debride nails x 10.     RTC 3 months.   Helane Gunther DPM

## 2022-11-27 ENCOUNTER — Ambulatory Visit (HOSPITAL_COMMUNITY)
Admission: RE | Admit: 2022-11-27 | Discharge: 2022-11-27 | Disposition: A | Discharge status: Home / Self Care | Payer: Medicare Other | Source: Ambulatory Visit | Attending: Cardiology | Admitting: Cardiology

## 2022-11-27 DIAGNOSIS — I2721 Secondary pulmonary arterial hypertension: Secondary | ICD-10-CM | POA: Insufficient documentation

## 2022-12-26 LAB — HEMOGLOBIN A1C: Hemoglobin A1C: 10.7

## 2023-01-09 ENCOUNTER — Ambulatory Visit (INDEPENDENT_AMBULATORY_CARE_PROVIDER_SITE_OTHER): Payer: Medicare Other | Admitting: Pulmonary Disease

## 2023-01-09 ENCOUNTER — Encounter: Payer: Self-pay | Admitting: Pulmonary Disease

## 2023-01-09 VITALS — BP 120/68 | HR 78 | Temp 98.4°F | Ht 68.5 in | Wt 211.0 lb

## 2023-01-09 DIAGNOSIS — G4733 Obstructive sleep apnea (adult) (pediatric): Secondary | ICD-10-CM | POA: Diagnosis not present

## 2023-01-09 DIAGNOSIS — I2721 Secondary pulmonary arterial hypertension: Secondary | ICD-10-CM

## 2023-01-09 NOTE — Progress Notes (Unsigned)
@Patient  ID: Leon Dennis, male    DOB: 04-15-1946, 77 y.o.   MRN: [id number]  Chief Complaint  Patient presents with  . Consult    Dx COPD 11/16/2022.  Dyspnea x 2 years.  Patient has pulmonary hypertension.    Referring provider: Majel Homer, MD  HPI:   PMH:  Smoker/ Smoking History:  Maintenance:   Pt of:   01/09/2023  - Visit     Questionaires / Pulmonary Flowsheets:   ACT:      No data to display          MMRC:     No data to display          Epworth:     05/24/2022   10:00 AM  Results of the Epworth flowsheet  Sitting and reading 1  Watching TV 1  Sitting, inactive in a public place (e.g. a theatre or a meeting) 0  As a passenger in a car for an hour without a break 0  Lying down to rest in the afternoon when circumstances permit 1  Sitting and talking to someone 0  Sitting quietly after a lunch without alcohol 0  In a car, while stopped for a few minutes in traffic 0  Total score 3    Tests:   FENO:  No results found for: "NITRICOXIDE"  PFT:    Latest Ref Rng & Units 05/30/2022    1:09 PM  PFT Results  FVC-Pre L 1.48   FVC-Predicted Pre % 37   FVC-Post L 1.57   FVC-Predicted Post % 39   Pre FEV1/FVC % % 94   Post FEV1/FCV % % 91   FEV1-Pre L 1.39   FEV1-Predicted Pre % 48   FEV1-Post L 1.43   DLCO uncorrected ml/min/mmHg 10.30   DLCO UNC% % 42   DLVA Predicted % 108   TLC L 8.40   TLC % Predicted % 122   RV % Predicted % 247     WALK:      No data to display          Imaging: No results found.  Lab Results:  CBC    Component Value Date/Time   WBC 6.1 10/31/2022 1239   RBC 5.41 10/31/2022 1239   HGB 16.0 10/31/2022 1239   HCT 48.5 10/31/2022 1239   PLT 190 10/31/2022 1239   MCV 89.6 10/31/2022 1239   MCH 29.6 10/31/2022 1239   MCHC 33.0 10/31/2022 1239   RDW 15.1 10/31/2022 1239   LYMPHSABS 0.8 08/18/2021 1454   MONOABS 0.7 08/18/2021 1454   EOSABS 0.1 08/18/2021 1454   BASOSABS 0.1 08/18/2021  1454    BMET    Component Value Date/Time   NA 134 (L) 10/31/2022 1239   NA 137 08/29/2021 0949   K 3.9 10/31/2022 1239   CL 99 10/31/2022 1239   CO2 24 10/31/2022 1239   GLUCOSE 269 (H) 10/31/2022 1239   BUN 39 (H) 10/31/2022 1239   BUN 22 08/29/2021 0949   CREATININE 1.88 (H) 10/31/2022 1239   CALCIUM 9.1 10/31/2022 1239   GFRNONAA 36 (L) 10/31/2022 1239    BNP    Component Value Date/Time   BNP 801.0 (H) 08/28/2022 1157    ProBNP No results found for: "PROBNP"  Specialty Problems   None   No Known Allergies  Immunization History  Administered Date(s) Administered  . Moderna Sars-Covid-2 Vaccination 07/14/2019, 08/11/2019    Past Medical History:  Diagnosis Date  . CHF (congestive heart  failure) (HCC)   . Diabetes mellitus without complication (HCC)   . Hypertension   . Kidney failure     Tobacco History: Social History   Tobacco Use  Smoking Status Never  Smokeless Tobacco Never   Counseling given: Not Answered   Continue to not smoke  Outpatient Encounter Medications as of 01/09/2023  Medication Sig  . acetaminophen (TYLENOL) 500 MG tablet Take 1,000 mg by mouth every 4 (four) hours as needed for mild pain.  Marland Kitchen amLODipine (NORVASC) 10 MG tablet Take 10 mg by mouth daily.  Marland Kitchen apixaban (ELIQUIS) 5 MG TABS tablet Take 1 tablet (5 mg total) by mouth 2 (two) times daily.  . bisoprolol (ZEBETA) 5 MG tablet Take 1 tablet (5 mg total) by mouth daily.  . calcitRIOL (ROCALTROL) 0.25 MCG capsule Take 0.25 mcg by mouth 2 (two) times a week. Monday & Friday  . FARXIGA 10 MG TABS tablet Take 10 mg by mouth daily.  . Finerenone 10 MG TABS Take 1 tablet by mouth daily at 6 (six) AM. [name]  . glimepiride (AMARYL) 4 MG tablet Take 4 mg by mouth in the morning and at bedtime.  . hydrALAZINE (APRESOLINE) 25 MG tablet Take 25 mg by mouth 2 (two) times daily.  Marland Kitchen lisinopril (ZESTRIL) 40 MG tablet Take 40 mg by mouth daily.  . Multiple Vitamin (MULTIVITAMIN) tablet  Take 1 tablet by mouth daily.  . pioglitazone (ACTOS) 30 MG tablet Take 30 mg by mouth daily.  . Polyvinyl Alcohol-Povidone (REFRESH OP) Place 1 drop into both eyes daily as needed (irritation).  . potassium chloride SA (KLOR-CON M) 20 MEQ tablet Take 20 mEq by mouth daily.  Marland Kitchen torsemide (DEMADEX) 20 MG tablet Take 3 tablets (60 mg total) by mouth 2 (two) times daily.   Facility-Administered Encounter Medications as of 01/09/2023  Medication  . sodium chloride flush (NS) 0.9 % injection 3 mL     Review of Systems  Review of Systems   Physical Exam  BP 120/68 (BP Location: Left Arm, Patient Position: Sitting, Cuff Size: Large)   Pulse 78   Temp 98.4 F (36.9 C) (Oral)   Ht 5' 8.5" (1.74 m)   Wt 211 lb (95.7 kg)   SpO2 96%   BMI 31.62 kg/m   Wt Readings from Last 5 Encounters:  01/09/23 211 lb (95.7 kg)  11/01/22 202 lb 3.2 oz (91.7 kg)  10/31/22 203 lb 6.4 oz (92.3 kg)  09/27/22 203 lb 12.8 oz (92.4 kg)  08/31/22 198 lb (89.8 kg)    BMI Readings from Last 5 Encounters:  01/09/23 31.62 kg/m  11/01/22 30.30 kg/m  10/31/22 30.93 kg/m  09/27/22 30.99 kg/m  08/31/22 30.11 kg/m     Physical Exam    Assessment & Plan:   Dyspnea on exertion: Suspect multifactorial related to pulmonary hypertension, presumed air trapping on PFTs although interpretability of these is possibly not reliable.  CT high-res with mild or minimal emphysema, no significant parenchymal disease.  Trial Spiriva.  Consider escalation to [facility] but we will avoid LABA for now given atrial fibrillation history.  Pulmonary hypertension: Felt to be multifactorial.  Certainly group 3 disease with untreated severe sleep apnea.  No significant hypoxemia which is good.  Minimal emphysema and no significant ILD on high-res CT scan recently.  Suspect basilar abnormalities are atelectasis.  PVR is elevated so element of Group 1 disease certainly possible.  He has had bilateral pleural effusions in the past begs  a question of pulmonary venous  hypertension however during his heart cath his wedge was not elevated.  He is encouraged to continue follow-up with heart failure.  Communicated with cardiologist today.   Return in about 3 months (around 04/10/2023) for f/u Dr. Judeth Horn.   Karren Burly, MD 01/09/2023   This appointment required 45 minutes of patient care (this includes precharting, chart review, review of results, face-to-face care, etc.).

## 2023-01-09 NOTE — Patient Instructions (Addendum)
It is nice to meet you  To help with the breathing lets try Spiriva 2 puffs once a day.  I provided samples.  If this is helpful to me a MyChart message and we can prescribe.  We consider other inhalers in the future like him with a slight risk of making atrial fibrillation worse, certainly worth trying the future if needed but will hold off for now.  I will work on arranging the sleep test so we can get CPAP for you.  Previously you are seeing Dr. Truddie Hidden for this.  I will ask for his help.  I will send Dr. Gasper Lloyd a message regarding the pulmonary hypertension.  Return to clinic in 3 months or sooner as needed with Dr. Judeth Horn

## 2023-01-10 ENCOUNTER — Other Ambulatory Visit: Payer: Self-pay | Admitting: Cardiology

## 2023-01-18 ENCOUNTER — Ambulatory Visit (HOSPITAL_COMMUNITY)
Admission: RE | Admit: 2023-01-18 | Discharge: 2023-01-18 | Disposition: A | Discharge status: Home / Self Care | Payer: Medicare Other | Source: Ambulatory Visit | Attending: Cardiology | Admitting: Cardiology

## 2023-01-18 ENCOUNTER — Encounter (HOSPITAL_COMMUNITY): Payer: Self-pay | Admitting: Cardiology

## 2023-01-18 VITALS — BP 110/70 | HR 69 | Wt 215.6 lb

## 2023-01-18 DIAGNOSIS — Z7984 Long term (current) use of oral hypoglycemic drugs: Secondary | ICD-10-CM | POA: Diagnosis not present

## 2023-01-18 DIAGNOSIS — Z79899 Other long term (current) drug therapy: Secondary | ICD-10-CM | POA: Diagnosis not present

## 2023-01-18 DIAGNOSIS — I4819 Other persistent atrial fibrillation: Secondary | ICD-10-CM | POA: Diagnosis not present

## 2023-01-18 DIAGNOSIS — N1832 Chronic kidney disease, stage 3b: Secondary | ICD-10-CM | POA: Insufficient documentation

## 2023-01-18 DIAGNOSIS — R0602 Shortness of breath: Secondary | ICD-10-CM | POA: Insufficient documentation

## 2023-01-18 DIAGNOSIS — R5383 Other fatigue: Secondary | ICD-10-CM | POA: Diagnosis not present

## 2023-01-18 DIAGNOSIS — J449 Chronic obstructive pulmonary disease, unspecified: Secondary | ICD-10-CM | POA: Insufficient documentation

## 2023-01-18 DIAGNOSIS — Z7722 Contact with and (suspected) exposure to environmental tobacco smoke (acute) (chronic): Secondary | ICD-10-CM | POA: Diagnosis not present

## 2023-01-18 DIAGNOSIS — I509 Heart failure, unspecified: Secondary | ICD-10-CM | POA: Insufficient documentation

## 2023-01-18 DIAGNOSIS — G4733 Obstructive sleep apnea (adult) (pediatric): Secondary | ICD-10-CM | POA: Diagnosis not present

## 2023-01-18 DIAGNOSIS — I2721 Secondary pulmonary arterial hypertension: Secondary | ICD-10-CM | POA: Insufficient documentation

## 2023-01-18 DIAGNOSIS — E1122 Type 2 diabetes mellitus with diabetic chronic kidney disease: Secondary | ICD-10-CM | POA: Diagnosis not present

## 2023-01-18 DIAGNOSIS — I130 Hypertensive heart and chronic kidney disease with heart failure and stage 1 through stage 4 chronic kidney disease, or unspecified chronic kidney disease: Secondary | ICD-10-CM | POA: Diagnosis not present

## 2023-01-18 NOTE — Progress Notes (Signed)
ADVANCED HEART FAILURE CLINIC NOTE  Referring Physician: Majel Homer, MD  Primary Care: Majel Homer, MD Primary Cardiologist: Dr. Wyline Mood HF: Dr. Gasper Lloyd  HPI: Leon Dennis is a 77 y.o. male with CKD 3A, type 2 diabetes, persistent atrial fibrillation and RV dysfunction presenting today for evaluation of pulmonary hypertension.Leon Dennis cardiac history dates back to roughly 1 year. He was becomingly fatigued and SOB last year with minimal exertion. He was diagnosed with atrial fibrillation. Shortly after diagnosis he was admitted with volume overload; failed x3 DCCV during admission. Due to persistent SOB, abdominal distention, dilated RV on TTE and LE edema he had a RHC which was consistent with pulmonary hypertension.  He reports no family history of pulmonary hypertension, connective tissue disease.  No reported history of smoking however did have significant secondhand exposure from his parents and wife.  Functionally, he can perform all ADLs without difficulty but becomes fatigued and mildly short of breath with moderate to excessive exertion.  Interval hx:  He has now seen Dr. Judeth Horn; on a daily inhaler. Reports that he has had some improvement in dyspnea. He can now walk longer distances and go up inclines without much difficulty. He has not received a CPAP yet.    Past Medical History:  Diagnosis Date   CHF (congestive heart failure) (HCC)    Diabetes mellitus without complication (HCC)    Hypertension    Kidney failure     Current Outpatient Medications  Medication Sig Dispense Refill   acetaminophen (TYLENOL) 500 MG tablet Take 1,000 mg by mouth every 4 (four) hours as needed for mild pain.     amLODipine (NORVASC) 10 MG tablet Take 10 mg by mouth daily.     apixaban (ELIQUIS) 5 MG TABS tablet Take 1 tablet (5 mg total) by mouth 2 (two) times daily. 28 tablet 0   bisoprolol (ZEBETA) 5 MG tablet Take 1 tablet (5 mg total) by mouth daily. 90 tablet 3    calcitRIOL (ROCALTROL) 0.25 MCG capsule Take 0.25 mcg by mouth 2 (two) times a week. Monday & Friday     FARXIGA 10 MG TABS tablet Take 10 mg by mouth daily.     Finerenone 10 MG TABS Take 1 tablet by mouth daily at 6 (six) AM. Leon Dennis     glimepiride (AMARYL) 4 MG tablet Take 4 mg by mouth in the morning and at bedtime.     hydrALAZINE (APRESOLINE) 25 MG tablet Take 25 mg by mouth 2 (two) times daily.     lisinopril (ZESTRIL) 40 MG tablet Take 40 mg by mouth daily.     pioglitazone (ACTOS) 30 MG tablet Take 30 mg by mouth daily.     potassium chloride SA (KLOR-CON M) 20 MEQ tablet take 2 tablets by mouth daily. 60 tablet 4   tiotropium (SPIRIVA HANDIHALER) 18 MCG inhalation capsule Place 18 mcg into inhaler and inhale daily. 2 puffs     torsemide (DEMADEX) 20 MG tablet Take 3 tablets (60 mg total) by mouth 2 (two) times daily. 540 tablet 3   Multiple Vitamin (MULTIVITAMIN) tablet Take 1 tablet by mouth daily.     Polyvinyl Alcohol-Povidone (REFRESH OP) Place 1 drop into both eyes daily as needed (irritation).     Current Facility-Administered Medications  Medication Dose Route Frequency Provider Last Rate Last Admin   sodium chloride flush (NS) 0.9 % injection 3 mL  3 mL Intravenous Q12H Branch, Dorothe Pea, MD        No Known Allergies  Social History   Socioeconomic History   Marital status: Married    Spouse name: Not on file   Number of children: Not on file   Years of education: Not on file   Highest education level: Not on file  Occupational History   Not on file  Tobacco Use   Smoking status: Never   Smokeless tobacco: Never  Substance and Sexual Activity   Alcohol use: Yes    Comment: occ.wine   Drug use: Never   Sexual activity: Not on file  Other Topics Concern   Not on file  Social History Narrative   Not on file   Social Determinants of Health   Financial Resource Strain: Not on file  Food Insecurity: Not on file  Transportation Needs: Not on file   Physical Activity: Not on file  Stress: Not on file  Social Connections: Not on file  Intimate Partner Violence: Not on file     History reviewed. No pertinent family history.  PHYSICAL EXAM: Vitals:   01/18/23 1127  BP: 110/70  Pulse: 69  SpO2: 96%   GENERAL: Well nourished, well developed, and in no apparent distress at rest.  HEENT: Negative for arcus senilis or xanthelasma. There is no scleral icterus.  The mucous membranes are pink and moist.   NECK: Supple, No masses. Normal carotid upstrokes without bruits. No masses or thyromegaly.    CHEST: There are no chest wall deformities. There is no chest wall tenderness. Respirations are unlabored.  Lungs- CTA B/L CARDIAC:  JVP: 7 cm H2O         Normal S1, S2  Normal rate with regular rhythm. No murmurs, rubs or gallops.  Pulses are 2+ and symmetrical in upper and lower extremities. No edema.  ABDOMEN: Soft, non-tender, non-distended. There are no masses or hepatomegaly. There are normal bowel sounds.  EXTREMITIES: Warm and well perfused with no cyanosis, clubbing.  LYMPHATIC: No axillary or supraclavicular lymphadenopathy.  NEUROLOGIC: Patient is oriented x3 with no focal or lateralizing neurologic deficits.  PSYCH: Patients affect is appropriate, there is no evidence of anxiety or depression.  SKIN: Warm and dry; no lesions or wounds.   DATA REVIEW  ECG: 10/31/22: Atrial fibrillation  As per my personal interpretation  ECHO: 09/19/2022: Normal LV function, mildly dilated RV with mildly reduced function as per my personal interpretation  CATH: RA 12, RV 60/13/14, PA 66/18 mean 40, PCWP 13 CO 5.28 L/min CI 2.59  PVR 5.12 Personally reviewed waveforms  :  10/31/22:  Pt ambulated 1400 ft (426.7 m) O2 Sat ranged 95%-90% on room air HR ranged 84-130   ASSESSMENT & PLAN:  Pulmonary hypertension -Right heart cath as noted above with PVR of 5 and preserved cardiac index.  Papi is also preserved.  Echocardiogram with  mild to moderately dilated RV with relatively normal function. -PFT from 1/24 consistent with emphysema/severe obstructive lung disease.  Will obtain high-res CT for further evaluation.  Outpatient pulmonology referral to start inhalers and aggressive management of obstructive lung disease. -Negative VQ scan from 09/08/2022 - continue bisoprolol.  - Currently taking torsemide 60mg  daily; will continue.  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  Could have elevated left atrial pressures from prolonged atrial fibrillation.   - Reports feeling very well today; wishes to continue follow up clinically at this time rather than making medication changes. I feel that he is very  stable from a PH standpoint. Feels some improvement in functional status after starting inhalers. Awaiting arrival of CPAP.   2.  Atrial fibrillation -DCCV x 3 previously which failed.  Will likely require antiarrhythmics.  Will plan for EP evaluation after pulmonology.  3.  CKD 3B -Baseline serum creatinine 1.3-1.8. -Repeat labs today. -Finerenone 10mg  daily, farxiga 10mg  daily.    Lindi Abram Advanced Heart Failure Mechanical Circulatory Support

## 2023-01-18 NOTE — Patient Instructions (Signed)
Medication Changes:  No Changes In Medications at this time.    Referrals:  YOU HAVE BEEN REFERRED TO Dr. Mayford Knife THEY WILL REACH OUT TO YOU OR CALL TO ARRANGE THIS. PLEASE CALL us WITH ANY CONCERNS   Follow-Up in: 4 months with Echo on the same day, PLEASE CALL OUR OFFICE AROUND NOVEMBER TO GET SCHEDULED FOR YOUR APPOINTMENT. PHONE NUMBER IS [phone] OPTION 2    At the [facility], you and your health needs are our priority. We have a designated team specialized in the treatment of Heart Failure. This Care Team includes your primary Heart Failure Specialized Cardiologist (physician), Advanced Practice Providers (APPs- Physician Assistants and Nurse Practitioners), and Pharmacist who all work together to provide you with the care you need, when you need it.   You may see any of the following providers on your designated Care Team at your next follow up:  Dr. Arvilla Meres Dr. Marca Ancona Dr. Marcos Eke, NP Robbie Lis, [place] [facility] [place], [place] Brynda Peon, NP Karle Plumber, PharmD   Please be sure to bring in all your medications bottles to every appointment.   Need to Contact us:  If you have any questions or concerns before your next appointment please send [place] a message through [place] or call our office at [phone].    TO LEAVE A MESSAGE FOR THE NURSE SELECT OPTION 2, PLEASE LEAVE A MESSAGE INCLUDING: YOUR NAME DATE OF BIRTH CALL BACK NUMBER REASON FOR CALL**this is important as we prioritize the call backs  YOU WILL RECEIVE A CALL BACK THE SAME DAY AS LONG AS YOU CALL BEFORE 4:00 PM

## 2023-02-21 ENCOUNTER — Encounter: Payer: Self-pay | Admitting: Podiatry

## 2023-02-21 ENCOUNTER — Ambulatory Visit (INDEPENDENT_AMBULATORY_CARE_PROVIDER_SITE_OTHER): Payer: Medicare Other | Admitting: Podiatry

## 2023-02-21 VITALS — Ht 68.5 in | Wt 215.6 lb

## 2023-02-21 DIAGNOSIS — E1159 Type 2 diabetes mellitus with other circulatory complications: Secondary | ICD-10-CM

## 2023-02-21 DIAGNOSIS — I739 Peripheral vascular disease, unspecified: Secondary | ICD-10-CM

## 2023-02-21 DIAGNOSIS — B351 Tinea unguium: Secondary | ICD-10-CM

## 2023-02-21 DIAGNOSIS — M79675 Pain in left toe(s): Secondary | ICD-10-CM

## 2023-02-21 DIAGNOSIS — M79674 Pain in right toe(s): Secondary | ICD-10-CM | POA: Diagnosis not present

## 2023-02-21 NOTE — Progress Notes (Signed)
This patient presents to the office with chief complaint of long thick nails and diabetic feet.  This patient  says there  is  no pain and discomfort in his  feet.  This patient says there are long thick painful nails.  These nails are painful walking and wearing shoes.    Patient is unable to  self treat his own nails . 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  This patient presents  to the office today for treatment of the  long nails   General Appearance  Alert, conversant and in no acute stress.  Vascular  Dorsalis pedis and posterior tibial  pulses are absent  bilaterally.  Capillary return is within normal limits  bilaterally. Temperature is within normal limits  bilaterally.  Neurologic  Senn-Weinstein monofilament wire test within normal limits  bilaterally. Muscle power within normal limits bilaterally.  Nails Thick disfigured discolored nails with subungual debris  from hallux to fifth toes bilaterally. No evidence of bacterial infection or drainage bilaterally.  Orthopedic  No limitations of motion of motion feet .  No crepitus or effusions noted.  No bony pathology or digital deformities noted.  Hallux limitus 1st MPJ  B/L.  Skin  normotropic skin with no porokeratosis noted bilaterally.  No signs of infections or ulcers noted.     Onychomycosis  Diabetes with no foot complications  Debride nails x 10.     RTC 3 months.   Helane Gunther DPM

## 2023-03-08 ENCOUNTER — Ambulatory Visit: Payer: PRIVATE HEALTH INSURANCE | Admitting: Nurse Practitioner

## 2023-03-13 ENCOUNTER — Ambulatory Visit: Payer: Medicare Other | Attending: Nurse Practitioner | Admitting: Nurse Practitioner

## 2023-03-13 ENCOUNTER — Encounter: Payer: Self-pay | Admitting: Nurse Practitioner

## 2023-03-13 VITALS — BP 110/78 | HR 76 | Ht 68.0 in | Wt 215.0 lb

## 2023-03-13 DIAGNOSIS — I5032 Chronic diastolic (congestive) heart failure: Secondary | ICD-10-CM

## 2023-03-13 DIAGNOSIS — I50812 Chronic right heart failure: Secondary | ICD-10-CM | POA: Insufficient documentation

## 2023-03-13 DIAGNOSIS — I4891 Unspecified atrial fibrillation: Secondary | ICD-10-CM | POA: Diagnosis not present

## 2023-03-13 DIAGNOSIS — N1832 Chronic kidney disease, stage 3b: Secondary | ICD-10-CM | POA: Diagnosis present

## 2023-03-13 DIAGNOSIS — I272 Pulmonary hypertension, unspecified: Secondary | ICD-10-CM | POA: Diagnosis not present

## 2023-03-13 DIAGNOSIS — G4733 Obstructive sleep apnea (adult) (pediatric): Secondary | ICD-10-CM | POA: Insufficient documentation

## 2023-03-13 MED ORDER — BISOPROLOL FUMARATE 5 MG PO TABS: 5.0000 mg | ORAL_TABLET | Freq: Every day | ORAL | 1 refills | Status: AC

## 2023-03-13 MED ORDER — HYDRALAZINE HCL 25 MG PO TABS: 25.0000 mg | ORAL_TABLET | Freq: Two times a day (BID) | ORAL | 1 refills | Status: DC

## 2023-03-13 MED ORDER — AMLODIPINE BESYLATE 10 MG PO TABS: 10.0000 mg | ORAL_TABLET | Freq: Every day | ORAL | 1 refills | Status: DC

## 2023-03-13 MED ORDER — POTASSIUM CHLORIDE CRYS ER 20 MEQ PO TBCR: 40.0000 meq | EXTENDED_RELEASE_TABLET | Freq: Every day | ORAL | 4 refills | Status: DC

## 2023-03-13 NOTE — Patient Instructions (Addendum)

## 2023-03-13 NOTE — Progress Notes (Unsigned)
Cardiology Office Note:  .   Date:  03/13/2023 ID:  Leon Dennis, DOB [birth year], MRN [id number] PCP: Leon Homer, MD  [facility] Providers Cardiologist:  Leon Rich, MD    History of Present Illness: .   Leon Dennis is a 77 y.o. male a PMH of chronic right-sided CHF/HFpEF, A-fib, OSA, emphysema, hx of foot ulcer, CKD stage 3b (follows Nephrology), and pulmonary HTN, who presents today for follow-up.   Last saw patient on 09/27/2022 post right cardiac catheterization- see results below. Was doing much better after procedure. Saw HF clinic yesterday, metoprolol switched to bisoprolol. Referred to Pulmonology.  Last seen for follow-up on November 01, 2022. Was doing well.   Today he presents for follow-up. Doing very well. Tells me he recently did a lot of house work and tolerated this well. Denies any chest pain, shortness of breath, palpitations, syncope, presyncope, dizziness, orthopnea, PND, swelling or significant weight changes, acute bleeding, or claudication.  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 Stays very active.  Studies Reviewed: .   Echo 09/20/2022:  1. Left ventricular ejection fraction, by estimation, is 60 to 65%. The  left ventricle has normal function. The left ventricle has no regional  wall motion abnormalities. Left ventricular diastolic parameters are  indeterminate.   2. Right ventricular systolic function is moderately reduced. The right  ventricular size is moderately enlarged. There is moderately elevated  pulmonary artery systolic pressure.   3. Right atrial size was mildly dilated.   4. The mitral valve is abnormal. Mild mitral valve regurgitation. No  evidence of mitral stenosis.   5. The tricuspid valve is abnormal.   6. The aortic valve is tricuspid. Aortic valve regurgitation is not  visualized. No aortic stenosis is present.   7. The inferior vena cava is normal in  size with greater than 50%  respiratory variability, suggesting right atrial pressure of 3 mmHg.   Comparison(s): Echocardiogram done 08/18/21 showed an EF of 55-60%.   RHC 08/31/2022: Moderate pulmonary HTN (mean PA 40 mmHg) with PVR 5.12   RA 12, RV 60/13/14, PA 66/18 mean 40, PCWP 13 CO 5.28 L/min CI 2.59  PVR 5.12  Risk Assessment/Calculations:    CHA2DS2-VASc Score = 3   This indicates a 3.2% annual risk of stroke. The patient's score is based upon: CHF History: 1 HTN History: 0 Diabetes History: 0 Stroke History: 0 Vascular Disease History: 0 Age Score: 2 Gender Score: 0  The 10-year ASCVD risk score (Arnett DK, et al., 2019) is: 45%   Values used to calculate the score:     Age: 44 years     Sex: Male     Is Non-Hispanic African American: No     Diabetic: Yes     Tobacco smoker: No     Systolic Blood Pressure: 110 mmHg     Is BP treated: Yes     HDL Cholesterol: 47 MG/DL     Total Cholesterol: 183 MG/DL  Physical Exam:   VS:  BP 110/78   Pulse 76   Ht 5\' 8"  (1.727 m)   Wt 215 lb (97.5 kg)   SpO2 97%   BMI 32.69 kg/m    Wt Readings from Last 3 Encounters:  03/13/23 215 lb (97.5 kg)  02/21/23 215 lb 9.6 oz (97.8 kg)  01/18/23 215 lb 9.6 oz (97.8 kg)    GEN: Well nourished, well developed in no  acute distress NECK: No JVD; No carotid bruits CARDIAC: S1/S2, irregular rhythm and regular rate, no murmurs, rubs, gallops RESPIRATORY:  Clear to auscultation without rales, wheezing or rhonchi  ABDOMEN: Soft, non-tender, non-distended EXTREMITIES:  No edema; No deformity   ASSESSMENT AND PLAN: .    Chronic right sided HF/ HFpEF Stage C, NYHA class I-II symptoms. Euvolemic and well compensated on exam. Continue Farxiga, hydralazine, lisinopril, bisoprolol, Torsemide, and potassium supplementation. Low sodium diet, fluid restriction <2L, and daily weights encouraged. Educated to contact our office for weight gain of 2 lbs overnight or 5 lbs in one week. Follow-up  with HF clinic as scheduled.   Pulmonary HTN RHC showed moderate pulmonary hypertension. Most likely d/t right sided HF/OSA/ and most likely emphysema, Group 3 and possible component of Group 2. Denies any recent symptoms. Continue current medication regimen at this time. Follow-up with HF clinic as mentioned above.  Heart healthy diet and regular cardiovascular exercise encouraged.   A-fib Hx of unsuccessful DCCV in 2023. Denies any tachycardia or palpitations. HR well controlled today. Continue current medication regimen. Continue Eliquis 5 mg BID, on appropriate dosage. Denies any bleeding issues. Heart healthy diet and regular cardiovascular exercise encouraged.   4.OSA Continue to follow-up with Pulmonology as scheduled   5. CKD stage 3b Recent labs revealed stable kidney disease. No medication changes at this time. Avoid nephrotoxic agents. Continue to follow-up with Nephrology.   Dispo: Will provide refills per his request. Follow-up with Dr. Dina Dennis or APP in 6 months or sooner if anything changes.   Signed, Leon Dory, NP

## 2023-03-27 ENCOUNTER — Ambulatory Visit: Payer: Medicare Other | Admitting: Nurse Practitioner

## 2023-04-04 ENCOUNTER — Ambulatory Visit: Payer: PRIVATE HEALTH INSURANCE | Admitting: Pulmonary Disease

## 2023-04-09 LAB — BASIC METABOLIC PANEL
BUN: 39 — AB (ref 4–21)
Creatinine: 2 — AB (ref 0.6–1.3)
Glucose: 223

## 2023-04-09 LAB — VITAMIN D 25 HYDROXY (VIT D DEFICIENCY, FRACTURES): Vit D, 25-Hydroxy: 34

## 2023-04-09 LAB — LIPID PANEL
LDL Cholesterol: 121
Triglycerides: 162 — AB (ref 40–160)

## 2023-04-09 LAB — COMPREHENSIVE METABOLIC PANEL: eGFR: 34

## 2023-04-09 LAB — HEMOGLOBIN A1C: Hemoglobin A1C: 11.2

## 2023-05-07 ENCOUNTER — Other Ambulatory Visit: Payer: Self-pay | Admitting: *Deleted

## 2023-05-07 MED ORDER — APIXABAN 5 MG PO TABS: 5.0000 mg | ORAL_TABLET | Freq: Two times a day (BID) | ORAL | 3 refills | Status: DC

## 2023-05-10 ENCOUNTER — Telehealth: Payer: Self-pay | Admitting: Cardiology

## 2023-05-10 NOTE — Telephone Encounter (Signed)
Faxed again at patient's request.

## 2023-05-10 NOTE — Telephone Encounter (Signed)
 Pt came into [place] office and stated that Leon Dennis received the faxed eliquis forms but they were blurry. They ask for [place] to refax them if we can!

## 2023-05-18 ENCOUNTER — Ambulatory Visit: Payer: Medicare Other | Admitting: Cardiology

## 2023-06-06 ENCOUNTER — Encounter: Payer: Self-pay | Admitting: Podiatry

## 2023-06-06 ENCOUNTER — Ambulatory Visit (INDEPENDENT_AMBULATORY_CARE_PROVIDER_SITE_OTHER): Payer: Medicare Other | Admitting: Podiatry

## 2023-06-06 DIAGNOSIS — I739 Peripheral vascular disease, unspecified: Secondary | ICD-10-CM | POA: Diagnosis not present

## 2023-06-06 DIAGNOSIS — E1159 Type 2 diabetes mellitus with other circulatory complications: Secondary | ICD-10-CM

## 2023-06-06 DIAGNOSIS — M79675 Pain in left toe(s): Secondary | ICD-10-CM | POA: Diagnosis not present

## 2023-06-06 DIAGNOSIS — M79674 Pain in right toe(s): Secondary | ICD-10-CM | POA: Diagnosis not present

## 2023-06-06 DIAGNOSIS — B351 Tinea unguium: Secondary | ICD-10-CM

## 2023-06-06 NOTE — Progress Notes (Addendum)
 This patient presents to the office with chief complaint of long thick nails and diabetic feet.  This patient  says there  is  no pain and discomfort in his  feet.  This patient says there are long thick painful nails.  These nails are painful walking and wearing shoes.    Patient is unable to  self treat his own nails . 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  This patient presents  to the office today for treatment of the  long nails   General Appearance  Alert, conversant and in no acute stress.  Vascular  Dorsalis pedis and posterior tibial  pulses are absent  bilaterally.  Capillary return is within normal limits  bilaterally. Temperature is within normal limits  bilaterally.  Neurologic  Senn-Weinstein monofilament wire test within normal limits  bilaterally. Muscle power within normal limits bilaterally.  Nails Thick disfigured discolored nails with subungual debris  from hallux to fifth toes bilaterally. No evidence of bacterial infection or drainage bilaterally.  Orthopedic  No limitations of motion of motion feet .  No crepitus or effusions noted.  No bony pathology or digital deformities noted.  Hallux limitus 1st MPJ  B/L.  Skin  normotropic skin with no porokeratosis noted bilaterally.  No signs of infections or ulcers noted.     Onychomycosis  Diabetes with no foot complications  Debride nails x 10. With nail nipper and dremel tool.    RTC 3 months.   Ruffin Cotton DPM

## 2023-06-07 ENCOUNTER — Telehealth (HOSPITAL_COMMUNITY): Payer: Self-pay | Admitting: Cardiology

## 2023-06-21 ENCOUNTER — Encounter (HOSPITAL_COMMUNITY): Payer: PRIVATE HEALTH INSURANCE | Admitting: Cardiology

## 2023-06-26 NOTE — Patient Instructions (Signed)

## 2023-06-27 ENCOUNTER — Ambulatory Visit (INDEPENDENT_AMBULATORY_CARE_PROVIDER_SITE_OTHER): Payer: Medicare Other | Admitting: Nurse Practitioner

## 2023-06-27 ENCOUNTER — Encounter: Payer: Self-pay | Admitting: Nurse Practitioner

## 2023-06-27 VITALS — BP 102/68 | HR 78 | Ht 68.0 in | Wt 219.2 lb

## 2023-06-27 DIAGNOSIS — E1165 Type 2 diabetes mellitus with hyperglycemia: Secondary | ICD-10-CM | POA: Diagnosis not present

## 2023-06-27 DIAGNOSIS — E782 Mixed hyperlipidemia: Secondary | ICD-10-CM | POA: Diagnosis not present

## 2023-06-27 DIAGNOSIS — Z7984 Long term (current) use of oral hypoglycemic drugs: Secondary | ICD-10-CM

## 2023-06-27 DIAGNOSIS — I10 Essential (primary) hypertension: Secondary | ICD-10-CM | POA: Diagnosis not present

## 2023-06-27 DIAGNOSIS — E559 Vitamin D deficiency, unspecified: Secondary | ICD-10-CM | POA: Diagnosis not present

## 2023-06-27 MED ORDER — ACCU-CHEK GUIDE ME W/DEVICE KIT: PACK | 0 refills | Status: AC

## 2023-06-27 MED ORDER — DEXCOM G7 RECEIVER DEVI: 1.0000 | Freq: Once | 0 refills | Status: AC

## 2023-06-27 MED ORDER — DEXCOM G7 SENSOR MISC: 1.0000 | 3 refills | Status: DC

## 2023-06-27 NOTE — Progress Notes (Signed)
 Endocrinology Consult Note       06/27/2023, 4:25 PM   Subjective:    Patient ID: Leon Dennis, male    DOB: 1945-09-17.  Leon Dennis is being seen in consultation for management of currently uncontrolled symptomatic diabetes requested by  Majel Homer, MD.   Past Medical History:  Diagnosis Date   CHF (congestive heart failure) (HCC)    Diabetes mellitus without complication (HCC)    Hypertension    Kidney failure     Past Surgical History:  Procedure Laterality Date   CARDIOVERSION N/A 08/22/2021   Procedure: CARDIOVERSION;  Surgeon: Meriam Sprague, MD;  Location: [facility] ENDOSCOPY;  Service: Cardiovascular;  Laterality: N/A;   RIGHT HEART CATH N/A 08/31/2022   Procedure: RIGHT HEART CATH;  Surgeon: Kathleene Hazel, MD;  Location: [facility] INVASIVE CV LAB;  Service: Cardiovascular;  Laterality: N/A;    Social History   Socioeconomic History   Marital status: Married    Spouse name: Not on file   Number of children: Not on file   Years of education: Not on file   Highest education level: Not on file  Occupational History   Not on file  Tobacco Use   Smoking status: Never   Smokeless tobacco: Never  Vaping Use   Vaping status: Never Used  Substance and Sexual Activity   Alcohol use: Yes    Comment: [email or web address]   Drug use: Never   Sexual activity: Not on file  Other Topics Concern   Not on file  Social History Narrative   Not on file   Social Drivers of Health   Financial Resource Strain: Not on file  Food Insecurity: Not on file  Transportation Needs: Not on file  Physical Activity: Not on file  Stress: Not on file  Social Connections: Not on file    Family History  Problem Relation Age of Onset   Cancer Mother    Hypertension Father    Hypertension Brother     Outpatient Encounter Medications as of 06/27/2023  Medication Sig   acetaminophen (TYLENOL) 500 MG tablet Take  1,000 mg by mouth every 4 (four) hours as needed for mild pain.   amLODipine (NORVASC) 10 MG tablet Take 1 tablet (10 mg total) by mouth daily.   apixaban (ELIQUIS) 5 MG TABS tablet Take 1 tablet (5 mg total) by mouth 2 (two) times daily.   bisoprolol (ZEBETA) 5 MG tablet Take 1 tablet (5 mg total) by mouth daily.   calcitRIOL (ROCALTROL) 0.25 MCG capsule Take 0.25 mcg by mouth 2 (two) times a week. Monday & Friday   Continuous Glucose Receiver (DEXCOM G7 RECEIVER) DEVI 1 Device by Does not apply route once for 1 dose.   Continuous Glucose Sensor (DEXCOM G7 SENSOR) MISC Inject 1 Application into the skin as directed. Change sensor every 10 days as directed.   FARXIGA 10 MG TABS tablet Take 10 mg by mouth daily.   Finerenone 10 MG TABS Take 1 tablet by mouth daily at 6 (six) AM. [name]   glimepiride (AMARYL) 4 MG tablet Take 4 mg by mouth in the morning and at bedtime.   hydrALAZINE (APRESOLINE) 25 MG tablet Take 1 tablet (25  mg total) by mouth 2 (two) times daily.   lisinopril (ZESTRIL) 40 MG tablet Take 40 mg by mouth daily.   Multiple Vitamin (MULTIVITAMIN) tablet Take 1 tablet by mouth daily.   Polyvinyl Alcohol-Povidone (REFRESH OP) Place 1 drop into both eyes daily as needed (irritation).   potassium chloride SA (KLOR-CON M) 20 MEQ tablet Take 2 tablets (40 mEq total) by mouth daily.   torsemide (DEMADEX) 20 MG tablet Take 3 tablets (60 mg total) by mouth 2 (two) times daily.   [DISCONTINUED] pioglitazone (ACTOS) 30 MG tablet Take 30 mg by mouth daily.   Facility-Administered Encounter Medications as of 06/27/2023  Medication   sodium chloride flush (NS) 0.9 % injection 3 mL    ALLERGIES: No Known Allergies  VACCINATION STATUS: Immunization History  Administered Date(s) Administered   Moderna Sars-Covid-2 Vaccination 07/14/2019, 08/11/2019    Diabetes He presents for his initial diabetic visit. He has type 2 diabetes mellitus. Onset time: diagnosed at approx age of 54. His  disease course has been fluctuating. There are no hypoglycemic associated symptoms. Associated symptoms include fatigue, polydipsia and polyuria. Pertinent negatives for diabetes include no foot ulcerations and no weight loss. There are no hypoglycemic complications. (Has had glucose drop into the 40s previously but does not get warning.) Symptoms are stable. Diabetic complications include heart disease (CHF), nephropathy and PVD. Risk factors for coronary artery disease include diabetes mellitus, dyslipidemia, male sex, hypertension and family history. Current diabetic treatment includes oral agent (triple therapy). His weight is fluctuating minimally. He is following a generally unhealthy diet. When asked about meal planning, he reported none. He has not had a previous visit with a dietitian. He participates in exercise intermittently. (He presents today for his consultation with no meter or logs to review.  His most recent A1c on 12/9 was 11.2%.  He notes he does not check glucose as often as he should, he and his wife share a meter.  He drinks unsweet tea, zero sugar 7up, occasionally a diet coke, along with water.  He eats 3 meals per day and snacks on nuts between.  He does not engage in routine physical activity but he does have plans to incorporate some soon (walking group with friends).  He is UTD on eye exam and has seen podiatry in the past.  ) An ACE inhibitor/angiotensin II receptor blocker is being taken. He sees a podiatrist.Eye exam is current.     Review of systems  Constitutional: + Minimally fluctuating body weight, current Body mass index is 33.33 kg/m., no fatigue, no subjective hyperthermia, no subjective hypothermia Eyes: no blurry vision, no xerophthalmia ENT: no sore throat, no nodules palpated in throat, no dysphagia/odynophagia, no hoarseness Cardiovascular: no chest pain, no shortness of breath, no palpitations, no leg swelling Respiratory: no cough, no shortness of  breath Gastrointestinal: no nausea/vomiting/diarrhea Musculoskeletal: no muscle/joint aches Skin: no rashes, no hyperemia Neurological: no tremors, no numbness, no tingling, no dizziness Psychiatric: no depression, no anxiety  Objective:     BP 102/68 (BP Location: Left Arm, Patient Position: Sitting, Cuff Size: Large)   Pulse 78   Ht 5\' 8"  (1.727 m)   Wt 219 lb 3.2 oz (99.4 kg)   BMI 33.33 kg/m   Wt Readings from Last 3 Encounters:  06/27/23 219 lb 3.2 oz (99.4 kg)  03/13/23 215 lb (97.5 kg)  02/21/23 215 lb 9.6 oz (97.8 kg)     BP Readings from Last 3 Encounters:  06/27/23 102/68  03/13/23 110/78  01/18/23  110/70     Physical Exam- Limited  Constitutional:  Body mass index is 33.33 kg/m. , not in acute distress, normal state of mind Eyes:  EOMI, no exophthalmos Neck: Supple Cardiovascular: irregular HR (Hx of afib), no murmurs, rubs, or gallops, no edema Respiratory: Adequate breathing efforts, no crackles, rales, rhonchi, or wheezing Musculoskeletal: no gross deformities, strength intact in all four extremities, no gross restriction of joint movements Skin:  no rashes, no hyperemia Neurological: no tremor with outstretched hands   Diabetic Foot Exam - Simple   No data filed      CMP ( most recent) CMP     Component Value Date/Time   NA 134 (L) 10/31/2022 1239   NA 137 08/29/2021 0949   K 3.9 10/31/2022 1239   CL 99 10/31/2022 1239   CO2 24 10/31/2022 1239   GLUCOSE 269 (H) 10/31/2022 1239   BUN 39 (A) 04/09/2023 0000   CREATININE 2.0 (A) 04/09/2023 0000   CREATININE 1.88 (H) 10/31/2022 1239   CALCIUM 9.1 10/31/2022 1239   PROT 7.8 10/31/2022 1239   PROT 7.0 08/29/2021 0949   ALBUMIN 4.0 10/31/2022 1239   ALBUMIN 4.3 08/29/2021 0949   AST 25 10/31/2022 1239   ALT 13 10/31/2022 1239   ALKPHOS 78 10/31/2022 1239   BILITOT 1.3 (H) 10/31/2022 1239   BILITOT 0.6 08/29/2021 0949   EGFR 34 04/09/2023 0000   EGFR 46 (L) 08/29/2021 0949   GFRNONAA 36  (L) 10/31/2022 1239     Diabetic Labs (most recent): Lab Results  Component Value Date   HGBA1C 11.2 04/09/2023   HGBA1C 10.7 12/26/2022   HGBA1C 7.3 (H) 08/18/2021     Lipid Panel ( most recent) Lipid Panel     Component Value Date/Time   CHOL 109 08/19/2021 0701   TRIG 162 (A) 04/09/2023 0000   HDL 47 08/19/2021 0701   CHOLHDL 2.3 08/19/2021 0701   VLDL 10 08/19/2021 0701   LDLCALC 121 04/09/2023 0000      Lab Results  Component Value Date   TSH 2.788 08/18/2021           Assessment & Plan:   1) Type 2 diabetes mellitus with hyperglycemia, without long-term current use of insulin (HCC) (Primary)  He presents today for his consultation with no meter or logs to review.  His most recent A1c on 12/9 was 11.2%.  He notes he does not check glucose as often as he should, he and his wife share a meter.  He drinks unsweet tea, zero sugar 7up, occasionally a diet coke, along with water.  He eats 3 meals per day and snacks on nuts between.  He does not engage in routine physical activity but he does have plans to incorporate some soon (walking group with friends).  He is UTD on eye exam and has seen podiatry in the past.    - Leon Dennis has currently uncontrolled symptomatic type 2 DM since 78 years of age, with most recent A1c of 11.2 %.   -Recent labs reviewed.  - I had a long discussion with him about the progressive nature of diabetes and the pathology behind its complications. -his diabetes is complicated by CKD, CHF, PVD and he remains at a high risk for more acute and chronic complications which include CAD, CVA, CKD, retinopathy, and neuropathy. These are all discussed in detail with him.  The following Lifestyle Medicine recommendations according to American College of Lifestyle Medicine [facility]) were discussed and offered to patient and  he agrees to start the journey:  A. Whole Foods, Plant-based plate comprising of fruits and vegetables, plant-based proteins,  whole-grain carbohydrates was discussed in detail with the patient.   A list for source of those nutrients were also provided to the patient.  Patient will use only water or unsweetened tea for hydration. B.  The need to stay away from risky substances including alcohol, smoking; obtaining 7 to 9 hours of restorative sleep, at least 150 minutes of moderate intensity exercise weekly, the importance of healthy social connections,  and stress reduction techniques were discussed. C.  A full color page of  Calorie density of various food groups per pound showing examples of each food groups was provided to the patient.  - I have counseled him on diet and weight management by adopting a carbohydrate restricted/protein rich diet. Patient is encouraged to switch to unprocessed or minimally processed complex starch and increased protein intake (animal or plant source), fruits, and vegetables. -  he is advised to stick to a routine mealtimes to eat 3 meals a day and avoid unnecessary snacks (to snack only to correct hypoglycemia).   - he acknowledges that there is a room for improvement in his food and drink choices. - Suggestion is made for him to avoid simple carbohydrates from his diet including Cakes, Sweet Desserts, Ice Cream, Soda (diet and regular), Sweet Tea, Candies, Chips, Cookies, Store Bought Juices, Alcohol in Excess of 1-2 drinks a day, Artificial Sweeteners, Coffee Creamer, and "Sugar-free" Products. This will help patient to have more stable blood glucose profile and potentially avoid unintended weight gain.  - I have approached him with the following individualized plan to manage his diabetes and patient agrees:    -he is encouraged to start/continue monitoring glucose 2 times daily, before breakfast and before bed, to log their readings on the clinic sheets provided, and bring them to review at follow up appointment in 3 months.  He could greatly benefit from CGM device given several episodes of  severe hypoglycemia and on sulfonylurea.  I sent Dexcom G7 to his local pharmacy and also [facility] just in case DME is preferred over pharmacy.  I also sent in standard meter for him as well, and instructed him not to share a device with anyone else.  - he is warned not to take insulin without proper monitoring per orders. - Adjustment parameters are given to him for hypo and hyperglycemia in writing. - he is encouraged to call clinic for blood glucose levels less than 70 or above 300 mg /dl. - he is advised to continue Farxiga 10 mg po daily, therapeutically suitable for patient, given history of CKD and CHF.  We did go over possible side effects of this class of medications to watch for.  He can also continue Glimepiride 4 mg po twice daily.  This medication can cause significant hypoglycemia if not careful, thus we may reduce on future visits. - his Actos will be discontinued, risk outweighs benefit for this patient- can worsen heart failure. - he is not a candidate for Metformin due to concurrent renal insufficiency.  - he will be considered for incretin therapy as appropriate next visit.  - Specific targets for  A1c; LDL, HDL, and Triglycerides were discussed with the patient.  2) Blood Pressure /Hypertension:  his blood pressure is controlled to target.   he is advised to continue his current medications as prescribed by PCP/cardiology.  3) Lipids/Hyperlipidemia:    Review of his recent lipid panel from  04/09/23 showed uncontrolled LDL at 121.  He does not appear to be on any statin medications at this time.   4)  Weight/Diet:  his Body mass index is 33.33 kg/m.  -  clearly complicating his diabetes care.   he is a candidate for weight loss. I discussed with him the fact that loss of 5 - 10% of his  current body weight will have the most impact on his diabetes management.  Exercise, and detailed carbohydrates information provided  -  detailed on discharge instructions.  5) Chronic  Care/Health Maintenance: -he is on ACEI/ARB and not Statin medications and is encouraged to initiate and continue to follow up with Ophthalmology, Dentist, Podiatrist at least yearly or according to recommendations, and advised to stay away from smoking. I have recommended yearly flu vaccine and pneumonia vaccine at least every 5 years; moderate intensity exercise for up to 150 minutes weekly; and sleep for at least 7 hours a day.  - he is advised to maintain close follow up with Majel Homer, MD for primary care needs, as well as his other providers for optimal and coordinated care.   - Time spent in this patient care: 60 min, of which > 50% was spent in counseling him about his diabetes and the rest reviewing his blood glucose logs, discussing his hypoglycemia and hyperglycemia episodes, reviewing his current and previous labs/studies (including abstraction from other facilities) and medications doses and developing a long term treatment plan based on the latest standards of care/guidelines; and documenting his care.    Please refer to Patient Instructions for Blood Glucose Monitoring and Insulin/Medications Dosing Guide" in media tab for additional information. Please also refer to "Patient Self Inventory" in the Media tab for reviewed elements of pertinent patient history.  Leon Dennis participated in the discussions, expressed understanding, and voiced agreement with the above plans.  All questions were answered to his satisfaction. he is encouraged to contact clinic should he have any questions or concerns prior to his return visit.     Follow up plan: - Return in about 3 months (around 09/24/2023) for Diabetes F/U with A1c in office, No previsit labs, Bring meter and logs.    Ronny Bacon, [facility] [address] [place], [place] [address] Phone: [phone] Fax: [phone]  06/27/2023, 4:25 PM

## 2023-07-01 ENCOUNTER — Encounter: Payer: Self-pay | Admitting: Nurse Practitioner

## 2023-07-02 NOTE — Telephone Encounter (Signed)
 See patient message

## 2023-07-02 NOTE — Telephone Encounter (Signed)
 See message.

## 2023-07-02 NOTE — Telephone Encounter (Signed)
 See patient message about appt with nurse for CGM training.

## 2023-07-03 ENCOUNTER — Ambulatory Visit: Admitting: Nurse Practitioner

## 2023-07-04 NOTE — Progress Notes (Signed)
 Patient presented to the office with his CGM Reader and sensors, the Decom G7. Patient's vital were taken and recorded. I went through the [facility] with the patient setting it up for him , the reviewing with him the information. Patient voiced understanding of the CGM and also was made aware that he could refer to the booklet and or call our office with any questions  We reviewed the sensor. Application sites , how to enter the code into the CGM when a new one was applied. The patient then decided that he would like the sensor to be applied to his left arm. We went over the correct place)s) to apply it and the technique. Site was cleaned with alcohol preps , Dexcom G7 ID # [id number] , expiration date 10/28/2024 was applied to the anterior,(upper) left arm. Patient tolerated well, no problems noted.  Patient voiced understanding of how to place the sensor every 10 days and where to find the 4 digit code to enter into the CGM for the paring of the two. Mr. Parson was made aware that if he had any other questions or needed help to please contact our office.

## 2023-08-16 ENCOUNTER — Telehealth: Payer: Self-pay | Admitting: Cardiology

## 2023-08-16 ENCOUNTER — Other Ambulatory Visit: Payer: Self-pay | Admitting: *Deleted

## 2023-08-16 MED ORDER — APIXABAN 5 MG PO TABS: 5.0000 mg | ORAL_TABLET | Freq: Two times a day (BID) | ORAL | 1 refills | Status: DC

## 2023-08-16 NOTE — Telephone Encounter (Signed)
 Prescription refill request for Eliquis received. Indication: AF Last office visit: 03/13/23  Donivan Furry NP Scr: 1.94 on 07/04/23 Age: 78 Weight: 97.5kg  Based on above findings Eliquis 5mg  twice daily is the appropriate dose.  Refill approved.

## 2023-08-16 NOTE — Telephone Encounter (Signed)
 Prescription refill request for Eliquis received. Indication: A Fib Last office visit: 03/13/23  Donivan Furry NP Scr: 1.94 on 07/04/23  Epic Age: 78 Weight: 97.5kg  Based on above findings Eliquis 5mg  twice daily is the appropriate dose.  Refill approved.

## 2023-08-16 NOTE — Telephone Encounter (Signed)
 1. Which medications need to be refilled? (please list name of each medication and dose if known)   Eliquis 5mg   2. Which pharmacy/location (including street and city if local pharmacy) is medication to be sent to?  [facility]  3. Do they need a 30 day or 90 day supply? 90

## 2023-08-22 ENCOUNTER — Telehealth: Payer: Self-pay | Admitting: Cardiology

## 2023-08-22 NOTE — Telephone Encounter (Signed)
 Patient calling the office for samples of medication:   1.  What medication and dosage are you requesting samples for?  Eliquis  5mg   2.  Are you currently out of this medication?    Pt will be out at the end of today and will not receive his prescription for 7 more days.

## 2023-08-23 MED ORDER — APIXABAN 5 MG PO TABS: 5.0000 mg | ORAL_TABLET | Freq: Two times a day (BID) | ORAL | Status: DC

## 2023-08-23 NOTE — Telephone Encounter (Signed)
 Returned to office and picked up eliquis  samples

## 2023-08-23 NOTE — Telephone Encounter (Signed)
Left message for patient to call back to provide more information

## 2023-08-23 NOTE — Telephone Encounter (Signed)
 Per Cleotis Daily: [phone] is here asking about eliquis  samples i see where you and tori called him"  Patient left office before eliquis  samples were ready.  Eliquis  samples are ready for pick up

## 2023-09-03 ENCOUNTER — Other Ambulatory Visit: Payer: Self-pay | Admitting: Cardiology

## 2023-10-03 ENCOUNTER — Encounter: Payer: Self-pay | Admitting: Nurse Practitioner

## 2023-10-03 ENCOUNTER — Ambulatory Visit (INDEPENDENT_AMBULATORY_CARE_PROVIDER_SITE_OTHER): Payer: Medicare Other | Admitting: Nurse Practitioner

## 2023-10-03 VITALS — BP 110/58 | HR 106 | Ht 68.0 in | Wt 209.0 lb

## 2023-10-03 DIAGNOSIS — E559 Vitamin D deficiency, unspecified: Secondary | ICD-10-CM | POA: Diagnosis not present

## 2023-10-03 DIAGNOSIS — I10 Essential (primary) hypertension: Secondary | ICD-10-CM

## 2023-10-03 DIAGNOSIS — E1165 Type 2 diabetes mellitus with hyperglycemia: Secondary | ICD-10-CM | POA: Diagnosis not present

## 2023-10-03 DIAGNOSIS — E782 Mixed hyperlipidemia: Secondary | ICD-10-CM | POA: Diagnosis not present

## 2023-10-03 DIAGNOSIS — Z7984 Long term (current) use of oral hypoglycemic drugs: Secondary | ICD-10-CM

## 2023-10-03 LAB — POCT GLYCOSYLATED HEMOGLOBIN (HGB A1C): Hemoglobin A1C: 11.4 % — AB (ref 4.0–5.6)

## 2023-10-03 MED ORDER — PEN NEEDLES 31G X 5 MM MISC: 3 refills | Status: AC

## 2023-10-03 MED ORDER — TOUJEO MAX SOLOSTAR 300 UNIT/ML ~~LOC~~ SOPN: 20.0000 [IU] | PEN_INJECTOR | Freq: Every evening | SUBCUTANEOUS | 3 refills | Status: DC

## 2023-10-03 MED ORDER — GLIMEPIRIDE 4 MG PO TABS: 4.0000 mg | ORAL_TABLET | Freq: Two times a day (BID) | ORAL | 3 refills | Status: AC

## 2023-10-03 NOTE — Progress Notes (Signed)
 Endocrinology Follow Up Note       10/03/2023, 2:30 PM   Subjective:    Patient ID: Leon Dennis, male    DOB: Apr 09, 1946.  Sohil Timko is being seen in follow up after being seen in consultation for management of currently uncontrolled symptomatic diabetes requested by  Harolyn Likes, MD.   Past Medical History:  Diagnosis Date   CHF (congestive heart failure) (HCC)    Diabetes mellitus without complication (HCC)    Hypertension    Kidney failure     Past Surgical History:  Procedure Laterality Date   CARDIOVERSION N/A 08/22/2021   Procedure: CARDIOVERSION;  Surgeon: Sonny Dust, MD;  Location: [facility] ENDOSCOPY;  Service: Cardiovascular;  Laterality: N/A;   RIGHT HEART CATH N/A 08/31/2022   Procedure: RIGHT HEART CATH;  Surgeon: Odie Benne, MD;  Location: [facility] INVASIVE CV LAB;  Service: Cardiovascular;  Laterality: N/A;    Social History   Socioeconomic History   Marital status: Married    Spouse name: Not on file   Number of children: Not on file   Years of education: Not on file   Highest education level: Not on file  Occupational History   Not on file  Tobacco Use   Smoking status: Never   Smokeless tobacco: Never  Vaping Use   Vaping status: Never Used  Substance and Sexual Activity   Alcohol use: Yes    Comment: [email or web address]   Drug use: Never   Sexual activity: Not on file  Other Topics Concern   Not on file  Social History Narrative   Not on file   Social Drivers of Health   Financial Resource Strain: Not on file  Food Insecurity: Not on file  Transportation Needs: Not on file  Physical Activity: Not on file  Stress: Not on file  Social Connections: Not on file    Family History  Problem Relation Age of Onset   Cancer Mother    Hypertension Father    Hypertension Brother     Outpatient Encounter Medications as of 10/03/2023  Medication Sig   acetaminophen   (TYLENOL ) 500 MG tablet Take 1,000 mg by mouth every 4 (four) hours as needed for mild pain.   amLODipine  (NORVASC ) 10 MG tablet Take 1 tablet (10 mg total) by mouth daily.   apixaban  (ELIQUIS ) 5 MG TABS tablet Take 1 tablet (5 mg total) by mouth 2 (two) times daily.   bisoprolol  (ZEBETA ) 5 MG tablet Take 1 tablet (5 mg total) by mouth daily.   Blood Glucose Monitoring Suppl (ACCU-CHEK GUIDE ME) w/Device KIT Use to check glucose twice daily   calcitRIOL (ROCALTROL) 0.25 MCG capsule Take 0.25 mcg by mouth 2 (two) times a week. Monday & Friday   Continuous Glucose Sensor (DEXCOM G7 SENSOR) MISC Inject 1 Application into the skin as directed. Change sensor every 10 days as directed.   FARXIGA  10 MG TABS tablet Take 10 mg by mouth daily.   hydrALAZINE  (APRESOLINE ) 25 MG tablet Take 1 tablet (25 mg total) by mouth 2 (two) times daily.   insulin  glargine, 2 Unit Dial, (TOUJEO MAX SOLOSTAR) 300 UNIT/ML Solostar Pen Inject 20 Units into the skin at bedtime.  Insulin  Pen Needle (PEN NEEDLES) 31G X 5 MM MISC Use to inject insulin  once daily   lisinopril  (ZESTRIL ) 40 MG tablet Take 40 mg by mouth daily.   Multiple Vitamin (MULTIVITAMIN) tablet Take 1 tablet by mouth daily.   Polyvinyl Alcohol-Povidone (REFRESH OP) Place 1 drop into both eyes daily as needed (irritation).   potassium chloride  SA (KLOR-CON  M) 20 MEQ tablet Take 2 tablets (40 mEq total) by mouth daily.   torsemide  (DEMADEX ) 20 MG tablet take 3 tablets (60 MILLIGRAM total) by mouth 2 (two) times daily.   [DISCONTINUED] glimepiride (AMARYL) 4 MG tablet Take 4 mg by mouth in the morning and at bedtime.   glimepiride (AMARYL) 4 MG tablet Take 1 tablet (4 mg total) by mouth in the morning and at bedtime.   Facility-Administered Encounter Medications as of 10/03/2023  Medication   sodium chloride  flush (NS) 0.9 % injection 3 mL    ALLERGIES: No Known Allergies  VACCINATION STATUS: Immunization History  Administered Date(s) Administered    Moderna Sars-Covid-2 Vaccination 07/14/2019, 08/11/2019    Diabetes He presents for his follow-up diabetic visit. He has type 2 diabetes mellitus. Onset time: diagnosed at approx age of 37. His disease course has been worsening. There are no hypoglycemic associated symptoms. Associated symptoms include fatigue, polydipsia and polyuria. Pertinent negatives for diabetes include no foot ulcerations and no weight loss. There are no hypoglycemic complications. (Has had glucose drop into the 40s previously but does not get warning.) Symptoms are stable. Diabetic complications include heart disease (CHF), nephropathy and PVD. Risk factors for coronary artery disease include diabetes mellitus, dyslipidemia, male sex, hypertension and family history. Current diabetic treatment includes oral agent (dual therapy). His weight is fluctuating minimally. He is following a generally unhealthy diet. When asked about meal planning, he reported none. He has not had a previous visit with a dietitian. He participates in exercise intermittently. His breakfast blood glucose range is generally >200 mg/dl. His lunch blood glucose range is generally >200 mg/dl. His dinner blood glucose range is generally >200 mg/dl. His bedtime blood glucose range is generally >200 mg/dl. His overall blood glucose range is >200 mg/dl. (He presents today with his CGM showing gross hyperglycemia overall.  His POCT A1c today is 11.4%, improving from last A1c in March of 11.8%.  He notes he has been trying to eat healthier, but he has so many appts to go to that he sometimes eats fast food.  Analysis of his CGM shows TIR 0%, TAR 100% (86% in very high range), TBR 0% with a GMI of 11.3%.) An ACE inhibitor/angiotensin II receptor blocker is being taken. He sees a podiatrist.Eye exam is current.     Review of systems  Constitutional: + decreasing body weight,  current Body mass index is 31.78 kg/m. , no fatigue, no subjective hyperthermia, no subjective  hypothermia Eyes: no blurry vision, no xerophthalmia ENT: no sore throat, no nodules palpated in throat, no dysphagia/odynophagia, no hoarseness Cardiovascular: no chest pain, no shortness of breath, no palpitations, no leg swelling Respiratory: no cough, no shortness of breath Gastrointestinal: no nausea/vomiting/diarrhea Musculoskeletal: no muscle/joint aches Skin: no rashes, no hyperemia Neurological: no tremors, no numbness, no tingling, no dizziness Psychiatric: no depression, no anxiety  Objective:     BP (!) 110/58 (BP Location: Left Arm, Patient Position: Sitting, Cuff Size: Large)   Pulse (!) 106   Ht 5\' 8"  (1.727 m)   Wt 209 lb (94.8 kg)   SpO2 97%   BMI 31.78 kg/m  Wt Readings from Last 3 Encounters:  10/03/23 209 lb (94.8 kg)  07/04/23 218 lb 9.6 oz (99.2 kg)  06/27/23 219 lb 3.2 oz (99.4 kg)     BP Readings from Last 3 Encounters:  10/03/23 (!) 110/58  07/04/23 106/78  06/27/23 102/68      Physical Exam- Limited  Constitutional:  Body mass index is 31.78 kg/m. , not in acute distress, normal state of mind Eyes:  EOMI, no exophthalmos Musculoskeletal: no gross deformities, strength intact in all four extremities, no gross restriction of joint movements Skin:  no rashes, no hyperemia Neurological: no tremor with outstretched hands   Diabetic Foot Exam - Simple   No data filed      CMP ( most recent) CMP     Component Value Date/Time   NA 134 (L) 10/31/2022 1239   NA 137 08/29/2021 0949   K 3.9 10/31/2022 1239   CL 99 10/31/2022 1239   CO2 24 10/31/2022 1239   GLUCOSE 269 (H) 10/31/2022 1239   BUN 39 (A) 04/09/2023 0000   CREATININE 2.0 (A) 04/09/2023 0000   CREATININE 1.88 (H) 10/31/2022 1239   CALCIUM 9.1 10/31/2022 1239   PROT 7.8 10/31/2022 1239   PROT 7.0 08/29/2021 0949   ALBUMIN  4.0 10/31/2022 1239   ALBUMIN  4.3 08/29/2021 0949   AST 25 10/31/2022 1239   ALT 13 10/31/2022 1239   ALKPHOS 78 10/31/2022 1239   BILITOT 1.3 (H)  10/31/2022 1239   BILITOT 0.6 08/29/2021 0949   EGFR 34 04/09/2023 0000   EGFR 46 (L) 08/29/2021 0949   GFRNONAA 36 (L) 10/31/2022 1239     Diabetic Labs (most recent): Lab Results  Component Value Date   HGBA1C 11.4 (A) 10/03/2023   HGBA1C 11.2 04/09/2023   HGBA1C 10.7 12/26/2022     Lipid Panel ( most recent) Lipid Panel     Component Value Date/Time   CHOL 109 08/19/2021 0701   TRIG 162 (A) 04/09/2023 0000   HDL 47 08/19/2021 0701   CHOLHDL 2.3 08/19/2021 0701   VLDL 10 08/19/2021 0701   LDLCALC 121 04/09/2023 0000      Lab Results  Component Value Date   TSH 2.788 08/18/2021           Assessment & Plan:   1) Type 2 diabetes mellitus with hyperglycemia, without long-term current use of insulin  (HCC) (Primary)  He presents today with his CGM showing gross hyperglycemia overall.  His POCT A1c today is 11.4%, improving from last A1c in March of 11.8%.  He notes he has been trying to eat healthier, but he has so many appts to go to that he sometimes eats fast food.  Analysis of his CGM shows TIR 0%, TAR 100% (86% in very high range), TBR 0% with a GMI of 11.3%.  - Olufemi Mofield has currently uncontrolled symptomatic type 2 DM since 78 years of age.   -Recent labs reviewed.  - I had a long discussion with him about the progressive nature of diabetes and the pathology behind its complications. -his diabetes is complicated by CKD, CHF, PVD and he remains at a high risk for more acute and chronic complications which include CAD, CVA, CKD, retinopathy, and neuropathy. These are all discussed in detail with him.  The following Lifestyle Medicine recommendations according to American College of Lifestyle Medicine [facility]) were discussed and offered to patient and he agrees to start the journey:  A. Whole Foods, Plant-based plate comprising of fruits and vegetables, plant-based proteins,  whole-grain carbohydrates was discussed in detail with the patient.   A list for source  of those nutrients were also provided to the patient.  Patient will use only water or unsweetened tea for hydration. B.  The need to stay away from risky substances including alcohol, smoking; obtaining 7 to 9 hours of restorative sleep, at least 150 minutes of moderate intensity exercise weekly, the importance of healthy social connections,  and stress reduction techniques were discussed. C.  A full color page of  Calorie density of various food groups per pound showing examples of each food groups was provided to the patient.  - Nutritional counseling repeated at each appointment due to patients tendency to fall back in to old habits.  - The patient admits there is a room for improvement in their diet and drink choices. -  Suggestion is made for the patient to avoid simple carbohydrates from their diet including Cakes, Sweet Desserts / Pastries, Ice Cream, Soda (diet and regular), Sweet Tea, Candies, Chips, Cookies, Sweet Pastries, Store Bought Juices, Alcohol in Excess of 1-2 drinks a day, Artificial Sweeteners, Coffee Creamer, and "Sugar-free" Products. This will help patient to have stable blood glucose profile and potentially avoid unintended weight gain.   - I encouraged the patient to switch to unprocessed or minimally processed complex starch and increased protein intake (animal or plant source), fruits, and vegetables.   - Patient is advised to stick to a routine mealtimes to eat 3 meals a day and avoid unnecessary snacks (to snack only to correct hypoglycemia).  - I have approached him with the following individualized plan to manage his diabetes and patient agrees:   -I discussed and initiated basal insulin  with Toujeo 20 units SQ nightly.  He can continue Farxiga  10 mg po daily and Glimepiride 4 mg po twice daily.  We went over proper insulin  injection technique today, wife is a nurse so he feels comfortable.  -he is encouraged to start/continue monitoring glucose 2 times daily, before  breakfast and before bed, and to call the clinic if he has readings less than 70 or above 300 for 3 tests in a row.  - he is warned not to take insulin  without proper monitoring per orders. - Adjustment parameters are given to him for hypo and hyperglycemia in writing.  - his Actos will be discontinued, risk outweighs benefit for this patient- can worsen heart failure. - he is not a candidate for Metformin due to concurrent renal insufficiency.  - he will be considered for incretin therapy as appropriate next visit.  - Specific targets for  A1c; LDL, HDL, and Triglycerides were discussed with the patient.  2) Blood Pressure /Hypertension:  his blood pressure is controlled to target.   he is advised to continue his current medications as prescribed by PCP/cardiology.  3) Lipids/Hyperlipidemia:    Review of his recent lipid panel from 04/09/23 showed uncontrolled LDL at 121.  He does not appear to be on any statin medications at this time.   4)  Weight/Diet:  his Body mass index is 31.78 kg/m.  -  clearly complicating his diabetes care.   he is a candidate for weight loss. I discussed with him the fact that loss of 5 - 10% of his  current body weight will have the most impact on his diabetes management.  Exercise, and detailed carbohydrates information provided  -  detailed on discharge instructions.  5) Chronic Care/Health Maintenance: -he is on ACEI/ARB and not Statin medications and is  encouraged to initiate and continue to follow up with Ophthalmology, Dentist, Podiatrist at least yearly or according to recommendations, and advised to stay away from smoking. I have recommended yearly flu vaccine and pneumonia vaccine at least every 5 years; moderate intensity exercise for up to 150 minutes weekly; and sleep for at least 7 hours a day.  - he is advised to maintain close follow up with Harolyn Likes, MD for primary care needs, as well as his other providers for optimal and coordinated  care.     I spent  46  minutes in the care of the patient today including review of labs from CMP, Lipids, Thyroid Function, Hematology (current and previous including abstractions from other facilities); face-to-face time discussing  his blood glucose readings/logs, discussing hypoglycemia and hyperglycemia episodes and symptoms, medications doses, his options of short and long term treatment based on the latest standards of care / guidelines;  discussion about incorporating lifestyle medicine;  and documenting the encounter. Risk reduction counseling performed per USPSTF guidelines to reduce obesity and cardiovascular risk factors.     Please refer to Patient Instructions for Blood Glucose Monitoring and Insulin /Medications Dosing Guide"  in media tab for additional information. Please  also refer to " Patient Self Inventory" in the Media  tab for reviewed elements of pertinent patient history.  Arnaldo Lang participated in the discussions, expressed understanding, and voiced agreement with the above plans.  All questions were answered to his satisfaction. he is encouraged to contact clinic should he have any questions or concerns prior to his return visit.     Follow up plan: - Return in about 3 months (around 01/03/2024) for Diabetes F/U with A1c in office, No previsit labs, Bring meter and logs.   Hulon Magic, [facility] [address] [place], [place] [address] Phone: [phone] Fax: [phone]  10/03/2023, 2:30 PM

## 2023-10-10 ENCOUNTER — Ambulatory Visit (INDEPENDENT_AMBULATORY_CARE_PROVIDER_SITE_OTHER): Payer: Medicare Other | Admitting: Podiatry

## 2023-10-10 DIAGNOSIS — M79675 Pain in left toe(s): Secondary | ICD-10-CM | POA: Diagnosis not present

## 2023-10-10 DIAGNOSIS — M79674 Pain in right toe(s): Secondary | ICD-10-CM | POA: Diagnosis not present

## 2023-10-10 DIAGNOSIS — B351 Tinea unguium: Secondary | ICD-10-CM

## 2023-10-10 DIAGNOSIS — I739 Peripheral vascular disease, unspecified: Secondary | ICD-10-CM | POA: Diagnosis not present

## 2023-10-10 DIAGNOSIS — E1159 Type 2 diabetes mellitus with other circulatory complications: Secondary | ICD-10-CM

## 2023-10-10 NOTE — Progress Notes (Signed)
 This patient presents to the office with chief complaint of long thick nails and diabetic feet.  This patient  says there  is  no pain and discomfort in his  feet.  This patient says there are long thick painful nails.  These nails are painful walking and wearing shoes.    Patient is unable to  self treat his own nails . 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  This patient presents  to the office today for treatment of the  long nails   General Appearance  Alert, conversant and in no acute stress.  Vascular  Dorsalis pedis and posterior tibial  pulses are absent  bilaterally.  Capillary return is within normal limits  bilaterally. Temperature is within normal limits  bilaterally.  Neurologic  Senn-Weinstein monofilament wire test within normal limits  bilaterally. Muscle power within normal limits bilaterally.  Nails Thick disfigured discolored nails with subungual debris  from hallux to fifth toes bilaterally. No evidence of bacterial infection or drainage bilaterally.  Orthopedic  No limitations of motion of motion feet .  No crepitus or effusions noted.  No bony pathology or digital deformities noted.  Hallux limitus 1st MPJ  B/L.  Skin  normotropic skin with no porokeratosis noted bilaterally.  No signs of infections or ulcers noted.     Onychomycosis  Diabetes with no foot complications  Debride nails x 10. With nail nipper and dremel tool.    RTC 3 months.   Ruffin Cotton DPM

## 2023-10-30 ENCOUNTER — Other Ambulatory Visit: Payer: Self-pay | Admitting: Nurse Practitioner

## 2023-10-31 ENCOUNTER — Encounter: Payer: Self-pay | Admitting: Nurse Practitioner

## 2023-11-06 ENCOUNTER — Other Ambulatory Visit: Payer: Self-pay | Admitting: *Deleted

## 2023-11-06 DIAGNOSIS — E1165 Type 2 diabetes mellitus with hyperglycemia: Secondary | ICD-10-CM

## 2023-11-06 DIAGNOSIS — Z7984 Long term (current) use of oral hypoglycemic drugs: Secondary | ICD-10-CM

## 2023-11-06 MED ORDER — TOUJEO MAX SOLOSTAR 300 UNIT/ML ~~LOC~~ SOPN: 30.0000 [IU] | PEN_INJECTOR | Freq: Every evening | SUBCUTANEOUS | 0 refills | Status: DC

## 2023-11-07 ENCOUNTER — Ambulatory Visit: Attending: Cardiology | Admitting: Cardiology

## 2023-11-07 ENCOUNTER — Telehealth: Payer: Self-pay | Admitting: Nurse Practitioner

## 2023-11-07 ENCOUNTER — Encounter: Payer: Self-pay | Admitting: Cardiology

## 2023-11-07 VITALS — BP 120/78 | HR 79 | Ht 68.5 in | Wt 204.6 lb

## 2023-11-07 DIAGNOSIS — I50812 Chronic right heart failure: Secondary | ICD-10-CM | POA: Insufficient documentation

## 2023-11-07 DIAGNOSIS — I4891 Unspecified atrial fibrillation: Secondary | ICD-10-CM | POA: Insufficient documentation

## 2023-11-07 DIAGNOSIS — I272 Pulmonary hypertension, unspecified: Secondary | ICD-10-CM | POA: Insufficient documentation

## 2023-11-07 MED ORDER — TORSEMIDE 60 MG PO TABS: 1.0000 mg | ORAL_TABLET | Freq: Two times a day (BID) | ORAL | Status: DC

## 2023-11-07 MED ORDER — ATORVASTATIN CALCIUM 20 MG PO TABS
20.0000 mg | ORAL_TABLET | Freq: Every day | ORAL | 3 refills | Status: AC
Start: 2023-11-07 — End: ?

## 2023-11-07 NOTE — Progress Notes (Signed)
 Clinical Summary Mr. Leon Dennis is a 78 y.o.male seen today for follow up of the following medical problems.   1. Chronic right sided HF - 07/2021 echo: LVEF 55-60%, indet diastolic, mild to mod RV dysfunction, mod RV enlargement, moderate pulm HTN PASP 51. 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 Breathing has improved over time - occasional swelling at times, taking 60mg  bid.        2.Afib - new diagnosis during 06/2021 visit with pcp - from notes prior trial of DCCV was unsuccesful 08/22/21 - no recent palpitations.  - home HRs 70s to 80s by his watch.    - no recent palpitations - no bleeding on eliquis .        3. CKD 3A - followed by Dr Rachele     4. DM2   5. OSA - severe OSA by recent sleep study - has not gotten cpap machine  6. HLD - 06/2023 TC 174 TG 151 HDL 40 LDL 104 - DM2, has not been on statin -   Underwent sleep test but this demonstrates severe sleep apnea and CPAP titration study was recommended.    SH: his wife Alberto Pina is also a patient of mine Past Medical History:  Diagnosis Date   CHF (congestive heart failure) (HCC)    Diabetes mellitus without complication (HCC)    Hypertension    Kidney failure      No Known Allergies   Current Outpatient Medications  Medication Sig Dispense Refill   acetaminophen  (TYLENOL ) 500 MG tablet Take 1,000 mg by mouth every 4 (four) hours as needed for mild pain.     amLODipine  (NORVASC ) 10 MG tablet Take 1 tablet (10 mg total) by mouth daily. 90 tablet 1   apixaban  (ELIQUIS ) 5 MG TABS tablet Take 1 tablet (5 mg total) by  mouth 2 (two) times daily.     bisoprolol  (ZEBETA ) 5 MG tablet Take 1 tablet (5 mg total) by mouth daily. 90 tablet 1   Blood Glucose Monitoring Suppl (ACCU-CHEK GUIDE ME) w/Device KIT Use to check glucose twice daily 1 kit 0   calcitRIOL (ROCALTROL) 0.25 MCG capsule Take 0.25 mcg by mouth 2 (two) times a week. Monday & Friday     Continuous Glucose Sensor (DEXCOM G7 SENSOR) MISC Inject 1 Application into the skin as directed. Change sensor every 10 days as directed. 9 each 3   FARXIGA  10 MG TABS tablet Take 10 mg by mouth daily.     glimepiride  (AMARYL ) 4 MG tablet Take 1 tablet (4 mg total) by mouth in the morning and at bedtime. 180 tablet 3   hydrALAZINE  (APRESOLINE ) 25 MG tablet Take 1 tablet (25 mg total) by mouth 2 (two) times daily. 180 tablet 1   insulin  glargine, 2 Unit Dial, (TOUJEO  MAX SOLOSTAR) 300 UNIT/ML Solostar Pen Inject 30 Units into the skin at bedtime. 12 mL 0   Insulin  Pen Needle (PEN NEEDLES) 31G X 5 MM MISC Use to inject insulin  once daily 100 each 3   lisinopril  (ZESTRIL ) 40 MG tablet Take 40 mg by  mouth daily.     Multiple Vitamin (MULTIVITAMIN) tablet Take 1 tablet by mouth daily.     Polyvinyl Alcohol-Povidone (REFRESH OP) Place 1 drop into both eyes daily as needed (irritation).     potassium chloride  SA (KLOR-CON  M) 20 MEQ tablet Take 2 tablets (40 mEq total) by mouth daily. 60 tablet 4   torsemide  (DEMADEX ) 20 MG tablet take 3 tablets (60 MILLIGRAM total) by mouth 2 (two) times daily. 540 tablet 0   Current Facility-Administered Medications  Medication Dose Route Frequency Provider Last Rate Last Admin   sodium chloride  flush (NS) 0.9 % injection 3 mL  3 mL Intravenous Q12H Alvan Dorn FALCON, MD         Past Surgical History:  Procedure Laterality Date   CARDIOVERSION N/A 08/22/2021   Procedure: CARDIOVERSION;  Surgeon: Hobart Powell BRAVO, MD;  Location: [facility] ENDOSCOPY;  Service: Cardiovascular;  Laterality: N/A;   RIGHT HEART CATH N/A 08/31/2022   Procedure:  RIGHT HEART CATH;  Surgeon: Verlin Lonni BIRCH, MD;  Location: [facility] INVASIVE CV LAB;  Service: Cardiovascular;  Laterality: N/A;     No Known Allergies    Family History  Problem Relation Age of Onset   Cancer Mother    Hypertension Father    Hypertension Brother      Social History Mr. Goynes reports that he has never smoked. He has never used smokeless tobacco. Mr. Nicolls reports current alcohol use.    Physical Examination Today's Vitals   11/07/23 1339  BP: 120/78  Pulse: 79  SpO2: 96%  Weight: 204 lb 9.6 oz (92.8 kg)  Height: 5' 8.5 (1.74 m)   Body mass index is 30.66 kg/m.  Gen: resting comfortably, no acute distress HEENT: no scleral icterus, pupils equal round and reactive, no palptable cervical adenopathy,  CV: irreg, no mrg, no jvd Resp: Clear to auscultation bilaterally GI: abdomen is soft, non-tender, non-distended, normal bowel sounds, no hepatosplenomegaly MSK: extremities are warm, no edema.  Skin: warm, no rash Neuro:  no focal deficits Psych: appropriate affect    Assessment and Plan   1.Chronic RV failure/Pulmonary HTN/HFpEF - followed by pulmonary and HF team, overdue for f/u with both clinics. We will work on helping him get scheduled - looks to be primarily group III pulm HTN - no significant symptoms - continue current meds   2. Afib/acquired thrombophilia - failed DCCV while admitted - rates controlled on lopressor  alone -no recent symptoms, continue current meds  3. DM2 - should be on statin, start atorvastatin  20mg  daily'    Dorn PHEBE Alvan, M.D.

## 2023-11-07 NOTE — Telephone Encounter (Signed)
 Call returned to Diane at [facility] in [place]. She shared that the patient had come to pick up his insulin , that he was almost out. She shared that his insulin  was last filled October 04 2023. He received 2 3 mL pens for a total of 6 mL of insulin . Patient was to be injecting 20 units at night, he told her that his wife was a Engineer, civil (consulting) and that she had been injecting the insulin . It was to soon for the patient's insulin  to be refilled. Per the pharmacist the patient should have enough insulin , taking it 20 units at night left. Patient was very upset.  On 11/06/2023 patient was sending messages to [facility] via MyChart, during those conversations, she increased the Toujeo  to 30 units at night. When the pharmacy tried to process the request, it came back to soon. Diane was going to call the patient and share that she could not over ride the prescription, and ask that he call his insurance company and explain  what was going on.

## 2023-11-07 NOTE — Patient Instructions (Addendum)
 Medication Instructions:  Your physician has recommended you make the following change in your medication:  Start taking Atorvastatin  20 mg once daily Continue taking all other medications as prescribed   Labwork: None  Testing/Procedures: None  Follow-Up: Your physician recommends that you schedule a follow-up appointment in: 6 months  Any Other Special Instructions Will Be Listed Below (If Applicable). Thank you for choosing [facility]!     If you need a refill on your cardiac medications before your next appointment, please call your pharmacy.

## 2023-11-07 NOTE — Telephone Encounter (Signed)
 Pharmacist from [facility] in [place] called about his RX Toujeo  Max .  Stated she needed a call back and to call [phone] and ask for the pharmacist.

## 2023-11-08 ENCOUNTER — Telehealth: Payer: Self-pay | Admitting: Cardiology

## 2023-11-08 MED ORDER — AMLODIPINE BESYLATE 10 MG PO TABS: 10.0000 mg | ORAL_TABLET | Freq: Every day | ORAL | 3 refills | Status: AC

## 2023-11-08 NOTE — Telephone Encounter (Signed)
 1. Which medications need to be refilled? (please list name of each medication and dose if known) Amlodipine  10mg   2. Which pharmacy/location (including street and city if local pharmacy) is medication to be sent to? [facility]  3. Do they need a 30 day or 90 day supply? 90

## 2023-11-08 NOTE — Telephone Encounter (Signed)
 Pt's medication was sent to pt's pharmacy as requested. Confirmation received.

## 2023-11-20 IMAGING — US US RENAL
1 series · 14 of 25 positions shown · non-contrast
Comparison: CT abdomen pelvis 08/03/2021

CLINICAL DATA: Chronic renal disease.

EXAM:
RENAL / URINARY TRACT ULTRASOUND COMPLETE

[Series 1: us renal · 14 of 101 slices shown]
[im 1/101]
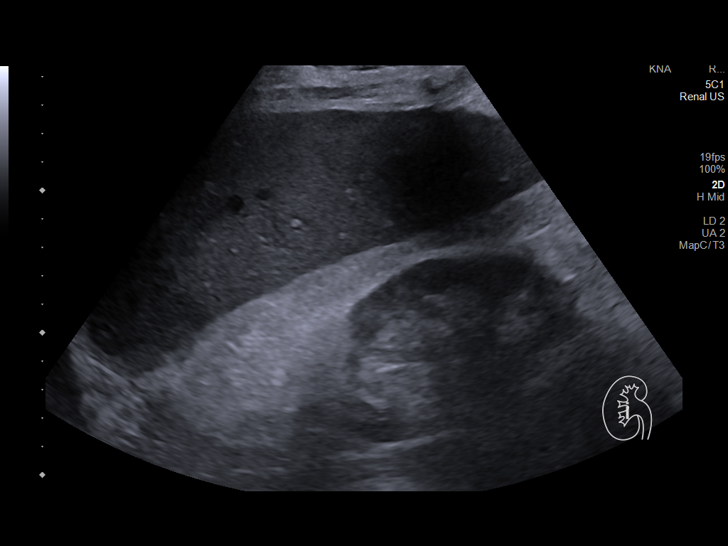
[im 9/101]
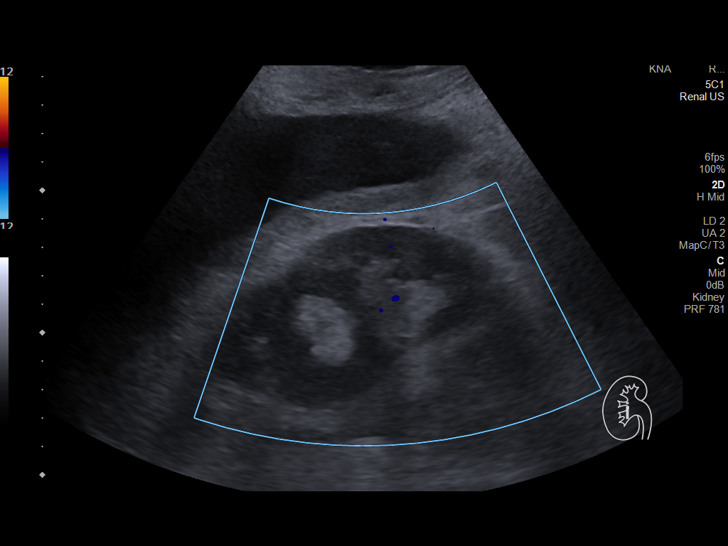
[im 17/101]
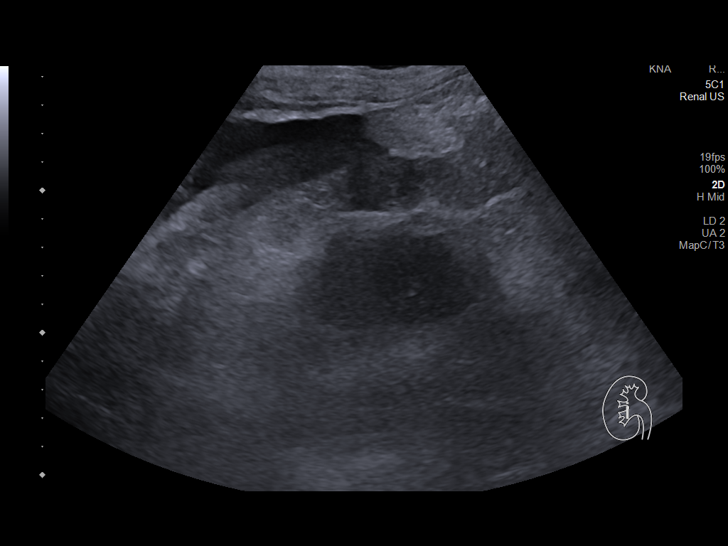
[im 26/101]
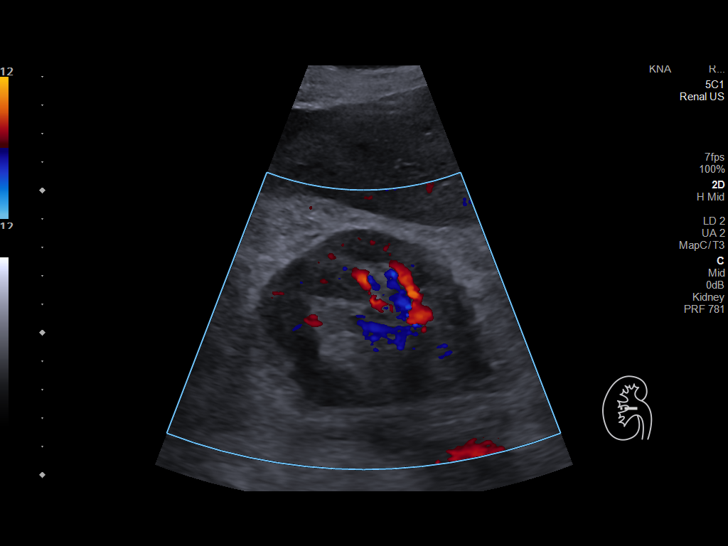
[im 34/101]
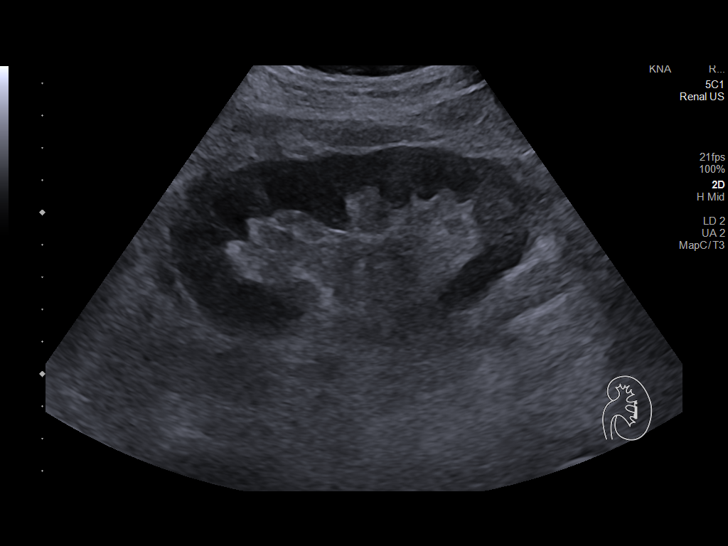
[im 38/101]
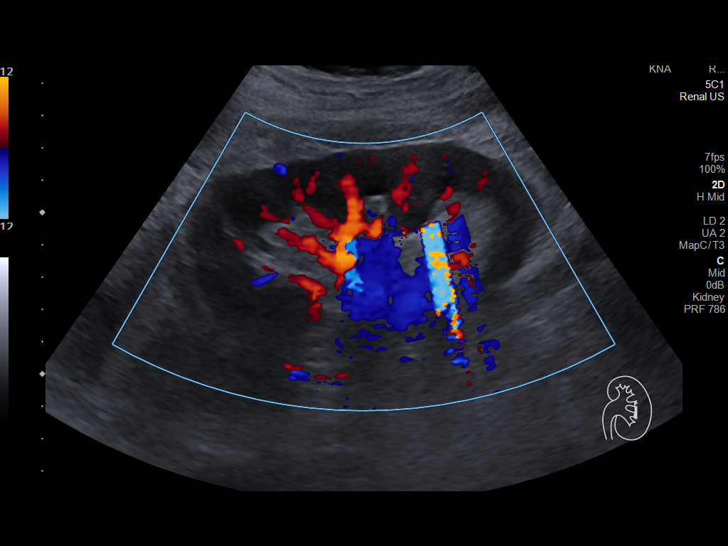
[im 46/101]
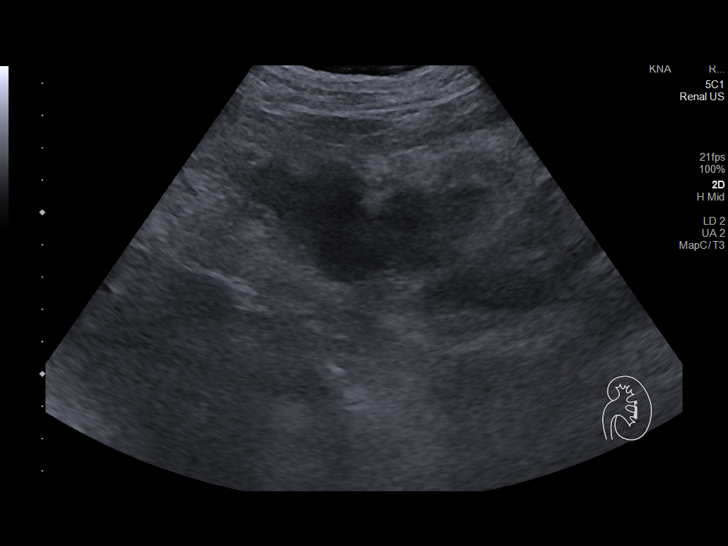
[im 55/101]
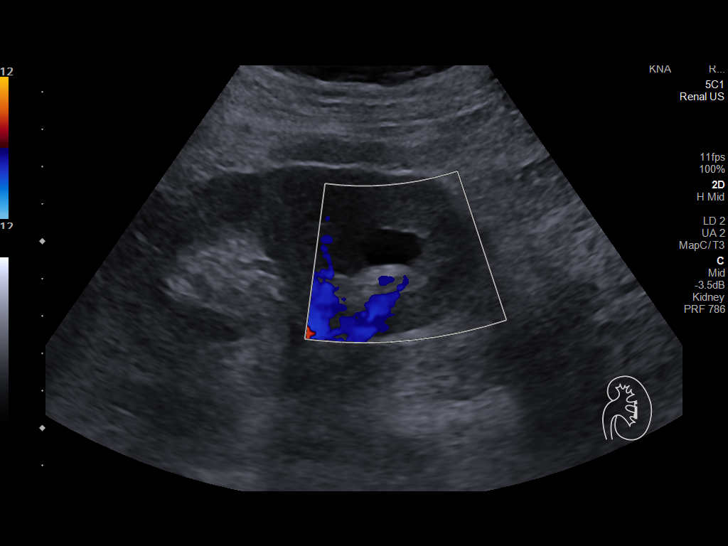
[im 63/101]
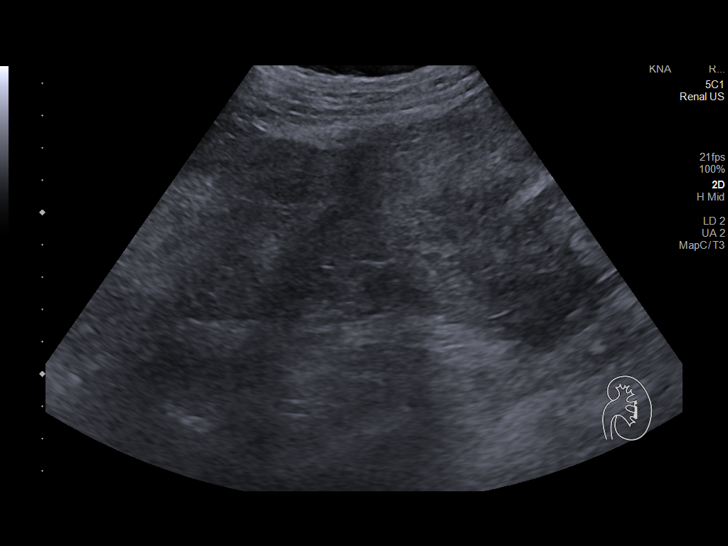
[im 67/101]
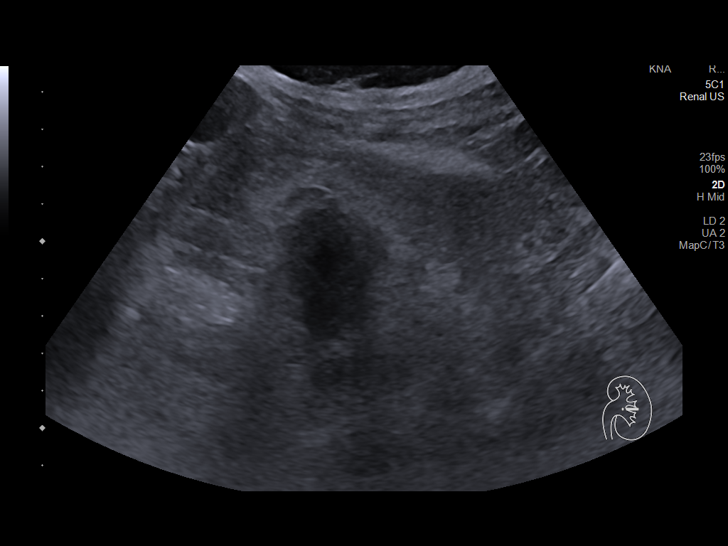
[im 76/101]
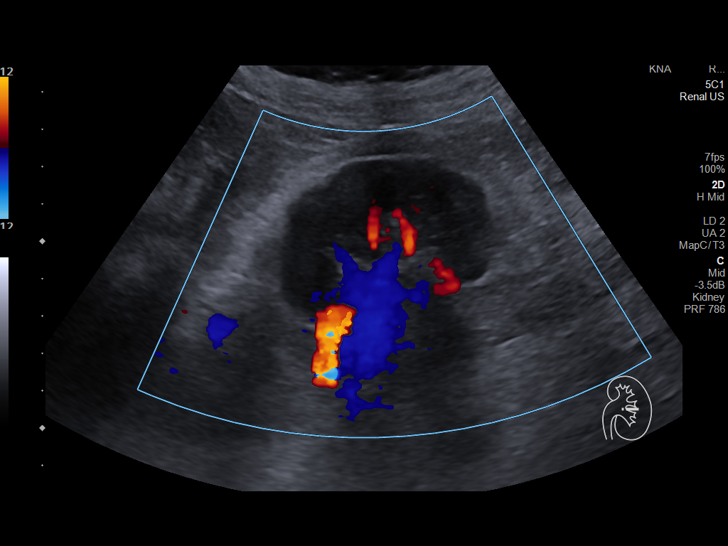
[im 84/101]
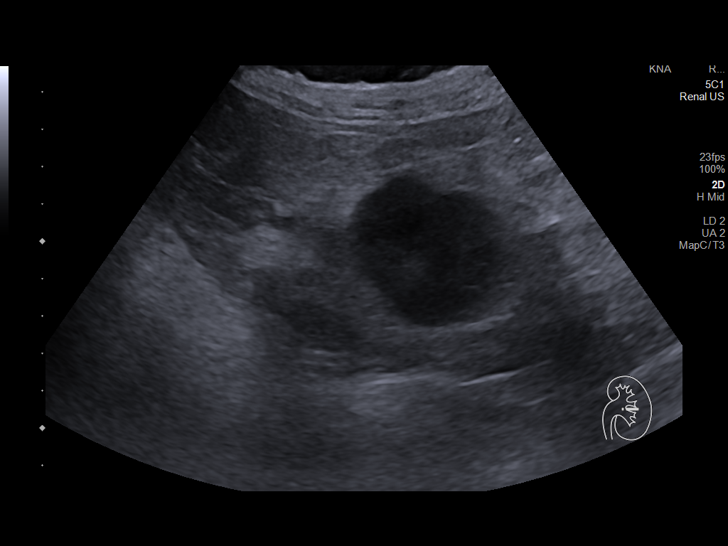
[im 92/101]
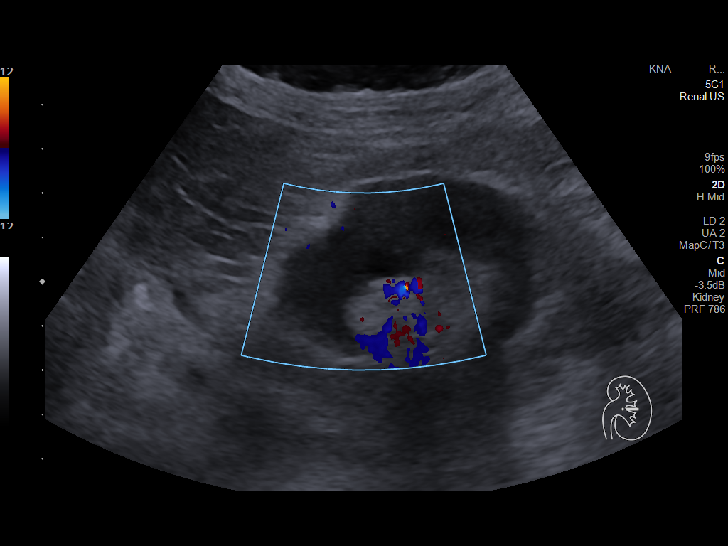
[im 101/101]
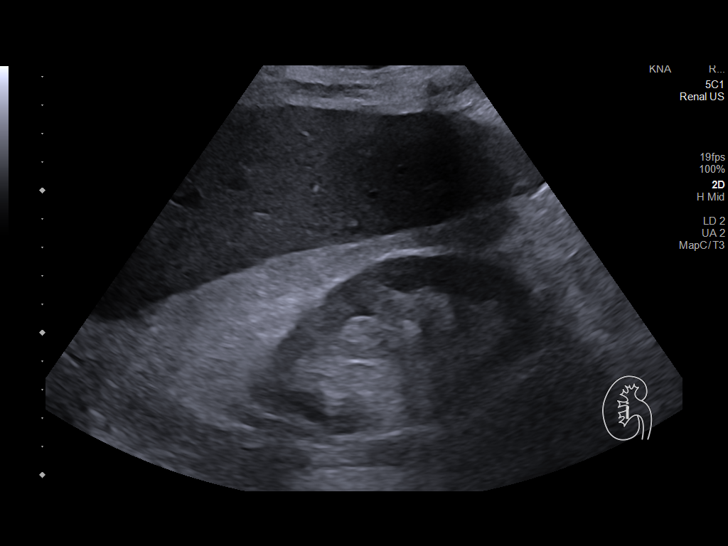

[14 of 25 positions shown; findings below may reference images not displayed]

FINDINGS: Right Kidney:

Renal measurements: 11.1 x 6.5 x 6.3 cm = volume: 235.2 mL.
Echogenicity within normal limits. No mass or hydronephrosis
visualized.

Left Kidney:

Renal measurements: 11.3 x 6.2 x 4.3 cm = volume: 155.2 mL.
Echogenicity within normal limits. No mass or hydronephrosis
visualized. Small stone within the midpole left kidney. Multiple
small cysts are demonstrated within the left kidney measuring up to
1.5 cm.

Bladder:

Appears normal for degree of bladder distention.

Other:

None.
IMPRESSION: No acute process.

No hydronephrosis.

Left nephrolithiasis.

## 2023-11-29 ENCOUNTER — Encounter: Payer: Self-pay | Admitting: Nurse Practitioner

## 2023-12-04 ENCOUNTER — Other Ambulatory Visit: Payer: Self-pay | Admitting: Cardiology

## 2023-12-06 ENCOUNTER — Encounter: Payer: Self-pay | Admitting: Nurse Practitioner

## 2023-12-06 ENCOUNTER — Other Ambulatory Visit: Payer: Self-pay | Admitting: Nurse Practitioner

## 2023-12-06 DIAGNOSIS — E1165 Type 2 diabetes mellitus with hyperglycemia: Secondary | ICD-10-CM

## 2023-12-06 DIAGNOSIS — Z7984 Long term (current) use of oral hypoglycemic drugs: Secondary | ICD-10-CM

## 2023-12-06 MED ORDER — TOUJEO MAX SOLOSTAR 300 UNIT/ML ~~LOC~~ SOPN: 30.0000 [IU] | PEN_INJECTOR | Freq: Every evening | SUBCUTANEOUS | 3 refills | Status: AC

## 2023-12-06 NOTE — Telephone Encounter (Signed)
 Noted

## 2023-12-06 NOTE — Telephone Encounter (Signed)
 I called patient back and he notes he had been taking the higher dose of insulin  but still not seeing any response.  Turns out, they were not taking off the safety cap on the needle.  Thus they started back at 20 units since Sunday and so far his fasting glucose has been around 190 (where it was in the 200s previously).  I did encourage him to take 30 units nightly and sent in new script for him.

## 2023-12-07 ENCOUNTER — Other Ambulatory Visit: Payer: Self-pay | Admitting: Cardiology

## 2023-12-07 ENCOUNTER — Telehealth: Payer: Self-pay | Admitting: Cardiology

## 2023-12-07 ENCOUNTER — Other Ambulatory Visit: Payer: Self-pay

## 2023-12-07 MED ORDER — TORSEMIDE 60 MG PO TABS: 1.0000 mg | ORAL_TABLET | Freq: Two times a day (BID) | ORAL | 3 refills | Status: DC

## 2023-12-07 NOTE — Telephone Encounter (Addendum)
 1. Which medications need to be refilled? (please list name of each medication and dose if known) Torsemide  20  2. Which pharmacy/location (including street and city if local pharmacy) is medication to be sent to? [facility]   3. Do they need a 30 day or 90 day supply? 90   Pt needs as soon as possible

## 2023-12-10 ENCOUNTER — Telehealth: Payer: Self-pay | Admitting: Cardiology

## 2023-12-10 ENCOUNTER — Other Ambulatory Visit: Payer: Self-pay

## 2023-12-10 MED ORDER — TORSEMIDE 60 MG PO TABS: 60.0000 mg | ORAL_TABLET | Freq: Two times a day (BID) | ORAL | 3 refills | Status: DC

## 2023-12-10 MED ORDER — APIXABAN 5 MG PO TABS: 5.0000 mg | ORAL_TABLET | Freq: Two times a day (BID) | ORAL | 5 refills | Status: AC

## 2023-12-10 NOTE — Telephone Encounter (Signed)
 I spoke with Leon Dennis's pharmacy and they have torsemide  20 mg tablet, sig: take 3 tablets (60 mg total) twice a day awaiting patient pick up,wife aware.

## 2023-12-10 NOTE — Telephone Encounter (Signed)
 Pt stated his pharmacy still has not received this medication and he is completely out

## 2023-12-10 NOTE — Telephone Encounter (Signed)
 Pt c/o medication issue:  1. Name of Medication:   Torsemide  60 MG TABS   2. How are you currently taking this medication (dosage and times per day)?   3 tablets, 2 x daily  3. Are you having a reaction (difficulty breathing--STAT)?   4. What is your medication issue?   Wife Sharlett) stated patient's medication was reduced and wants patient's medication back to 3 tablets, 2 x daily. Wife stated patient has been out of the medication for a week and wants a call back to confirm medication change.

## 2023-12-18 LAB — HEMOGLOBIN A1C: Hemoglobin A1C: 11.6

## 2023-12-18 LAB — LIPID PANEL: LDL Cholesterol: 50

## 2023-12-26 LAB — MICROALBUMIN / CREATININE URINE RATIO: Microalb Creat Ratio: 53

## 2023-12-26 LAB — BASIC METABOLIC PANEL WITH GFR
Glucose: 148
Potassium: 3.4 meq/L — AB (ref 3.5–5.1)

## 2023-12-26 LAB — PROTEIN / CREATININE RATIO, URINE
Albumin, U: 17.7
Creatinine, Urine: 33.6

## 2023-12-26 LAB — COMPREHENSIVE METABOLIC PANEL WITH GFR: eGFR: 44

## 2024-01-08 ENCOUNTER — Encounter: Payer: Self-pay | Admitting: Nurse Practitioner

## 2024-01-08 ENCOUNTER — Ambulatory Visit (INDEPENDENT_AMBULATORY_CARE_PROVIDER_SITE_OTHER): Admitting: Nurse Practitioner

## 2024-01-08 VITALS — BP 110/60 | HR 82 | Ht 68.5 in | Wt 208.8 lb

## 2024-01-08 DIAGNOSIS — E1165 Type 2 diabetes mellitus with hyperglycemia: Secondary | ICD-10-CM | POA: Diagnosis not present

## 2024-01-08 DIAGNOSIS — I10 Essential (primary) hypertension: Secondary | ICD-10-CM | POA: Diagnosis not present

## 2024-01-08 DIAGNOSIS — Z794 Long term (current) use of insulin: Secondary | ICD-10-CM | POA: Diagnosis not present

## 2024-01-08 DIAGNOSIS — Z7984 Long term (current) use of oral hypoglycemic drugs: Secondary | ICD-10-CM

## 2024-01-08 DIAGNOSIS — E782 Mixed hyperlipidemia: Secondary | ICD-10-CM

## 2024-01-08 DIAGNOSIS — E559 Vitamin D deficiency, unspecified: Secondary | ICD-10-CM

## 2024-01-08 NOTE — Progress Notes (Signed)
 Endocrinology Follow Up Note       01/08/2024, 6:32 PM   Subjective:    Patient ID: Leon Dennis, male    DOB: 1946/02/03.  Leon Dennis is being seen in follow up after being seen in consultation for management of currently uncontrolled symptomatic diabetes requested by  Leon Handing, MD.   Past Medical History:  Diagnosis Date   CHF (congestive heart failure) (HCC)    Diabetes mellitus without complication (HCC)    Hypertension    Kidney failure     Past Surgical History:  Procedure Laterality Date   CARDIOVERSION N/A 08/22/2021   Procedure: CARDIOVERSION;  Surgeon: Hobart Powell BRAVO, MD;  Location: [facility] ENDOSCOPY;  Service: Cardiovascular;  Laterality: N/A;   RIGHT HEART CATH N/A 08/31/2022   Procedure: RIGHT HEART CATH;  Surgeon: Verlin Lonni BIRCH, MD;  Location: [facility] INVASIVE CV LAB;  Service: Cardiovascular;  Laterality: N/A;    Social History   Socioeconomic History   Marital status: Married    Spouse name: Not on file   Number of children: Not on file   Years of education: Not on file   Highest education level: Not on file  Occupational History   Not on file  Tobacco Use   Smoking status: Never   Smokeless tobacco: Never  Vaping Use   Vaping status: Never Used  Substance and Sexual Activity   Alcohol use: Yes    Comment: [email or web address]   Drug use: Never   Sexual activity: Not on file  Other Topics Concern   Not on file  Social History Narrative   Not on file   Social Drivers of Health   Financial Resource Strain: Not on file  Food Insecurity: Not on file  Transportation Needs: Not on file  Physical Activity: Not on file  Stress: Not on file  Social Connections: Not on file    Family History  Problem Relation Age of Onset   Cancer Mother    Hypertension Father    Hypertension Brother     Outpatient Encounter Medications as of 01/08/2024  Medication Sig   acetaminophen   (TYLENOL ) 500 MG tablet Take 1,000 mg by mouth every 4 (four) hours as needed for mild pain.   amLODipine  (NORVASC ) 10 MG tablet Take 1 tablet (10 mg total) by mouth daily.   apixaban  (ELIQUIS ) 5 MG TABS tablet Take 1 tablet (5 mg total) by mouth 2 (two) times daily.   atorvastatin  (LIPITOR) 20 MG tablet Take 1 tablet (20 mg total) by mouth daily.   bisoprolol  (ZEBETA ) 5 MG tablet Take 1 tablet (5 mg total) by mouth daily.   Blood Glucose Monitoring Suppl (ACCU-CHEK GUIDE ME) w/Device KIT Use to check glucose twice daily   calcitRIOL (ROCALTROL) 0.25 MCG capsule Take 0.25 mcg by mouth 2 (two) times a week. Monday & Friday   Continuous Glucose Sensor (DEXCOM G7 SENSOR) MISC Inject 1 Application into the skin as directed. Change sensor every 10 days as directed.   FARXIGA  10 MG TABS tablet Take 10 mg by mouth daily.   Finerenone 10 MG TABS Take 10 mg by mouth daily.   glimepiride  (AMARYL ) 4 MG tablet Take 1 tablet (4 mg total) by  mouth in the morning and at bedtime.   hydrALAZINE  (APRESOLINE ) 25 MG tablet Take 1 tablet (25 mg total) by mouth 2 (two) times daily.   insulin  glargine, 2 Unit Dial, (TOUJEO  MAX SOLOSTAR) 300 UNIT/ML Solostar Pen Inject 30 Units into the skin at bedtime.   Insulin  Pen Needle (PEN NEEDLES) 31G X 5 MM MISC Use to inject insulin  once daily   lisinopril  (ZESTRIL ) 40 MG tablet Take 40 mg by mouth daily.   Multiple Vitamin (MULTIVITAMIN) tablet Take 1 tablet by mouth daily.   Polyvinyl Alcohol-Povidone (REFRESH OP) Place 1 drop into both eyes daily as needed (irritation).   potassium chloride  SA (KLOR-CON  M) 20 MEQ tablet Take 2 tablets (40 mEq total) by mouth daily.   Torsemide  60 MG TABS Take 60 mg by mouth 2 (two) times daily. (Patient taking differently: Take 20 mg by mouth 2 (two) times daily. Patient takes (3)  20 mg (2) times daily)   Facility-Administered Encounter Medications as of 01/08/2024  Medication   sodium chloride  flush (NS) 0.9 % injection 3 mL     ALLERGIES: No Known Allergies  VACCINATION STATUS: Immunization History  Administered Date(s) Administered   Moderna Sars-Covid-2 Vaccination 07/14/2019, 08/11/2019    Diabetes He presents for his follow-up diabetic visit. He has type 2 diabetes mellitus. Onset time: diagnosed at approx age of 89. His disease course has been improving. There are no hypoglycemic associated symptoms. Associated symptoms include fatigue, polydipsia and polyuria. Pertinent negatives for diabetes include no foot ulcerations. There are no hypoglycemic complications. (Has had glucose drop into the 40s previously but does not get warning.) Symptoms are improving. Diabetic complications include heart disease (CHF), nephropathy and PVD. Risk factors for coronary artery disease include diabetes mellitus, dyslipidemia, male sex, hypertension and family history. Current diabetic treatment includes oral agent (dual therapy) and insulin  injections. He is compliant with treatment most of the time. His weight is fluctuating minimally. He is following a generally unhealthy diet. When asked about meal planning, he reported none. He has not had a previous visit with a dietitian. He participates in exercise intermittently. His home blood glucose trend is decreasing steadily. His breakfast blood glucose range is generally 90-110 mg/dl. His lunch blood glucose range is generally >200 mg/dl. His dinner blood glucose range is generally >200 mg/dl. His bedtime blood glucose range is generally >200 mg/dl. His overall blood glucose range is >200 mg/dl. (He presents today with his CGM showing at target fasting and above target postprandial readings.  His most recent A1c from 12/18/23 was 11.6%, increasing from last visit of 11.4%. Analysis of his CGM shows TIR 26%, TAR 74% (48% in very high range), TBR 0% with a GMI of 9.1%.  He did reach out between visits for dosage adjustment in insulin  but ended up not taking the safety cap off the needle.   He admits he could still do better with his diet.) An ACE inhibitor/angiotensin II receptor blocker is being taken. He sees a podiatrist.Eye exam is current.     Review of systems  Constitutional: + stable body weight,  current Body mass index is 31.29 kg/m. , no fatigue, no subjective hyperthermia, no subjective hypothermia Eyes: no blurry vision, no xerophthalmia ENT: no sore throat, no nodules palpated in throat, no dysphagia/odynophagia, no hoarseness Cardiovascular: no chest pain, no shortness of breath, no palpitations, no leg swelling Respiratory: no cough, no shortness of breath Gastrointestinal: no nausea/vomiting/diarrhea Musculoskeletal: no muscle/joint aches Skin: no rashes, no hyperemia Neurological: no tremors, no numbness,  no tingling, no dizziness Psychiatric: no depression, no anxiety  Objective:     BP 110/60 (BP Location: Right Arm, Patient Position: Sitting, Cuff Size: Large)   Pulse 82   Ht 5' 8.5 (1.74 m)   Wt 208 lb 12.8 oz (94.7 kg)   BMI 31.29 kg/m   Wt Readings from Last 3 Encounters:  01/08/24 208 lb 12.8 oz (94.7 kg)  11/07/23 204 lb 9.6 oz (92.8 kg)  10/03/23 209 lb (94.8 kg)     BP Readings from Last 3 Encounters:  01/08/24 110/60  11/07/23 120/78  10/03/23 (!) 110/58      Physical Exam- Limited  Constitutional:  Body mass index is 31.29 kg/m. , not in acute distress, normal state of mind Eyes:  EOMI, no exophthalmos Musculoskeletal: no gross deformities, strength intact in all four extremities, no gross restriction of joint movements Skin:  no rashes, no hyperemia Neurological: no tremor with outstretched hands   Diabetic Foot Exam - Simple   Simple Foot Form Diabetic Foot exam was performed with the following findings: Yes 01/08/2024  3:56 PM  Visual Inspection No deformities, no ulcerations, no other skin breakdown bilaterally: Yes Sensation Testing Intact to touch and monofilament testing bilaterally: Yes Pulse Check Posterior  Tibialis and Dorsalis pulse intact bilaterally: Yes Comments Onychomycosis bilaterally      CMP ( most recent) CMP     Component Value Date/Time   NA 134 (L) 10/31/2022 1239   NA 137 08/29/2021 0949   K 3.4 (A) 12/26/2023 0000   CL 99 10/31/2022 1239   CO2 24 10/31/2022 1239   GLUCOSE 269 (H) 10/31/2022 1239   BUN 39 (A) 04/09/2023 0000   CREATININE 2.0 (A) 04/09/2023 0000   CREATININE 1.88 (H) 10/31/2022 1239   CALCIUM  9.1 10/31/2022 1239   PROT 7.8 10/31/2022 1239   PROT 7.0 08/29/2021 0949   ALBUMIN  4.0 10/31/2022 1239   ALBUMIN  4.3 08/29/2021 0949   AST 25 10/31/2022 1239   ALT 13 10/31/2022 1239   ALKPHOS 78 10/31/2022 1239   BILITOT 1.3 (H) 10/31/2022 1239   BILITOT 0.6 08/29/2021 0949   EGFR 44 12/26/2023 0000   EGFR 46 (L) 08/29/2021 0949   GFRNONAA 36 (L) 10/31/2022 1239     Diabetic Labs (most recent): Lab Results  Component Value Date   HGBA1C 11.6 12/18/2023   HGBA1C 11.4 (A) 10/03/2023   HGBA1C 11.2 04/09/2023     Lipid Panel ( most recent) Lipid Panel     Component Value Date/Time   CHOL 109 08/19/2021 0701   TRIG 162 (A) 04/09/2023 0000   HDL 47 08/19/2021 0701   CHOLHDL 2.3 08/19/2021 0701   VLDL 10 08/19/2021 0701   LDLCALC 50 12/18/2023 0000      Lab Results  Component Value Date   TSH 2.788 08/18/2021           Assessment & Plan:   1) Type 2 diabetes mellitus with hyperglycemia, without long-term current use of insulin  (HCC) (Primary)  He presents today with his CGM showing at target fasting and above target postprandial readings.  His most recent A1c from 12/18/23 was 11.6%, increasing from last visit of 11.4%. Analysis of his CGM shows TIR 26%, TAR 74% (48% in very high range), TBR 0% with a GMI of 9.1%.  He did reach out between visits for dosage adjustment in insulin  but ended up not taking the safety cap off the needle.  He admits he could still do better with his diet.  -  Leon Dennis has currently uncontrolled  symptomatic type 2 DM since 78 years of age.   -Recent labs reviewed.  - I had a long discussion with him about the progressive nature of diabetes and the pathology behind its complications. -his diabetes is complicated by CKD, CHF, PVD and he remains at a high risk for more acute and chronic complications which include CAD, CVA, CKD, retinopathy, and neuropathy. These are all discussed in detail with him.  The following Lifestyle Medicine recommendations according to American College of Lifestyle Medicine [facility]) were discussed and offered to patient and he agrees to start the journey:  A. Whole Foods, Plant-based plate comprising of fruits and vegetables, plant-based proteins, whole-grain carbohydrates was discussed in detail with the patient.   A list for source of those nutrients were also provided to the patient.  Patient will use only water or unsweetened tea for hydration. B.  The need to stay away from risky substances including alcohol, smoking; obtaining 7 to 9 hours of restorative sleep, at least 150 minutes of moderate intensity exercise weekly, the importance of healthy social connections,  and stress reduction techniques were discussed. C.  A full color page of  Calorie density of various food groups per pound showing examples of each food groups was provided to the patient.  - Nutritional counseling repeated at each appointment due to patients tendency to fall back in to old habits.  - The patient admits there is a room for improvement in their diet and drink choices. -  Suggestion is made for the patient to avoid simple carbohydrates from their diet including Cakes, Sweet Desserts / Pastries, Ice Cream, Soda (diet and regular), Sweet Tea, Candies, Chips, Cookies, Sweet Pastries, Store Bought Juices, Alcohol in Excess of 1-2 drinks a day, Artificial Sweeteners, Coffee Creamer, and Sugar-free Products. This will help patient to have stable blood glucose profile and potentially avoid  unintended weight gain.   - I encouraged the patient to switch to unprocessed or minimally processed complex starch and increased protein intake (animal or plant source), fruits, and vegetables.   - Patient is advised to stick to a routine mealtimes to eat 3 meals a day and avoid unnecessary snacks (to snack only to correct hypoglycemia).  - I have approached him with the following individualized plan to manage his diabetes and patient agrees:   -He is advised to continue Toujeo  30 units SQ nightly.  He can continue Farxiga  10 mg po daily and Glimepiride  4 mg po twice daily.  We did talk about several dietary adjustments to prevent escalation in regimen.  -he is encouraged to start/continue monitoring glucose 2 times daily (using his CGM), before breakfast and before bed, and to call the clinic if he has readings less than 70 or above 300 for 3 tests in a row.  - he is warned not to take insulin  without proper monitoring per orders. - Adjustment parameters are given to him for hypo and hyperglycemia in writing.  - his Actos will be discontinued, risk outweighs benefit for this patient- can worsen heart failure. - he is not a candidate for Metformin due to concurrent renal insufficiency.  - he will be considered for incretin therapy as appropriate next visit.  - Specific targets for  A1c; LDL, HDL, and Triglycerides were discussed with the patient.  2) Blood Pressure /Hypertension:  his blood pressure is controlled to target.   he is advised to continue his current medications as prescribed by PCP/cardiology.  3) Lipids/Hyperlipidemia:  Review of his recent lipid panel from 12/18/23 showed controlled LDL at 50.  He is advised to continue Lipitor 20 mg po daily.   4)  Weight/Diet:  his Body mass index is 31.29 kg/m.  -  clearly complicating his diabetes care.   he is a candidate for weight loss. I discussed with him the fact that loss of 5 - 10% of his  current body weight will have the  most impact on his diabetes management.  Exercise, and detailed carbohydrates information provided  -  detailed on discharge instructions.  5) Chronic Care/Health Maintenance: -he is on ACEI/ARB and not Statin medications and is encouraged to initiate and continue to follow up with Ophthalmology, Dentist, Podiatrist at least yearly or according to recommendations, and advised to stay away from smoking. I have recommended yearly flu vaccine and pneumonia vaccine at least every 5 years; moderate intensity exercise for up to 150 minutes weekly; and sleep for at least 7 hours a day.  - he is advised to maintain close follow up with Leon Handing, MD for primary care needs, as well as his other providers for optimal and coordinated care.     I spent  41  minutes in the care of the patient today including review of labs from CMP, Lipids, Thyroid Function, Hematology (current and previous including abstractions from other facilities); face-to-face time discussing  his blood glucose readings/logs, discussing hypoglycemia and hyperglycemia episodes and symptoms, medications doses, his options of short and long term treatment based on the latest standards of care / guidelines;  discussion about incorporating lifestyle medicine;  and documenting the encounter. Risk reduction counseling performed per USPSTF guidelines to reduce obesity and cardiovascular risk factors.     Please refer to Patient Instructions for Blood Glucose Monitoring and Insulin /Medications Dosing Guide  in media tab for additional information. Please  also refer to  Patient Self Inventory in the Media  tab for reviewed elements of pertinent patient history.  Leon Dennis participated in the discussions, expressed understanding, and voiced agreement with the above plans.  All questions were answered to his satisfaction. he is encouraged to contact clinic should he have any questions or concerns prior to his return visit.     Follow  up plan: - Return in about 3 months (around 04/08/2024) for Diabetes F/U with A1c in office, No previsit labs, Bring meter and logs.  Leon Dennis, [facility] [address] [place], [place] [address] Phone: [phone] Fax: [phone]  01/08/2024, 6:32 PM

## 2024-01-09 ENCOUNTER — Encounter (INDEPENDENT_AMBULATORY_CARE_PROVIDER_SITE_OTHER): Admitting: Nurse Practitioner

## 2024-01-09 DIAGNOSIS — E1165 Type 2 diabetes mellitus with hyperglycemia: Secondary | ICD-10-CM

## 2024-01-10 NOTE — Progress Notes (Signed)
 Erroneous encounter

## 2024-01-30 ENCOUNTER — Ambulatory Visit: Payer: Self-pay | Admitting: Podiatry

## 2024-01-30 DIAGNOSIS — M79675 Pain in left toe(s): Secondary | ICD-10-CM

## 2024-01-30 DIAGNOSIS — I739 Peripheral vascular disease, unspecified: Secondary | ICD-10-CM

## 2024-01-30 DIAGNOSIS — M79674 Pain in right toe(s): Secondary | ICD-10-CM

## 2024-01-30 DIAGNOSIS — E1159 Type 2 diabetes mellitus with other circulatory complications: Secondary | ICD-10-CM

## 2024-01-30 DIAGNOSIS — B351 Tinea unguium: Secondary | ICD-10-CM

## 2024-01-30 NOTE — Progress Notes (Signed)
 This patient presents to the office with chief complaint of long thick nails and diabetic feet.  This patient  says there  is  no pain and discomfort in his  feet.  This patient says there are long thick painful nails.  These nails are painful walking and wearing shoes.    Patient is unable to  self treat his own nails . 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  This patient presents  to the office today for treatment of the  long nails   General Appearance  Alert, conversant and in no acute stress.  Vascular  Dorsalis pedis and posterior tibial  pulses are absent  bilaterally.  Capillary return is within normal limits  bilaterally. Temperature is within normal limits  bilaterally.  Neurologic  Senn-Weinstein monofilament wire test within normal limits  bilaterally. Muscle power within normal limits bilaterally.  Nails Thick disfigured discolored nails with subungual debris  from hallux to fifth toes bilaterally. No evidence of bacterial infection or drainage bilaterally.  Orthopedic  No limitations of motion of motion feet .  No crepitus or effusions noted.  No bony pathology or digital deformities noted.  Hallux limitus 1st MPJ  B/L.  Skin  normotropic skin with no porokeratosis noted bilaterally.  No signs of infections or ulcers noted.     Onychomycosis  Diabetes with no foot complications  Debride nails x 10. With nail nipper and dremel tool.    RTC 3 months.   Ruffin Cotton DPM

## 2024-02-01 ENCOUNTER — Other Ambulatory Visit: Payer: Self-pay | Admitting: Nurse Practitioner

## 2024-02-22 ENCOUNTER — Telehealth: Payer: Self-pay | Admitting: Cardiology

## 2024-02-22 ENCOUNTER — Other Ambulatory Visit: Payer: Self-pay | Admitting: *Deleted

## 2024-02-22 MED ORDER — POTASSIUM CHLORIDE CRYS ER 20 MEQ PO TBCR: 40.0000 meq | EXTENDED_RELEASE_TABLET | Freq: Every day | ORAL | 3 refills | Status: AC

## 2024-02-22 NOTE — Telephone Encounter (Signed)
 1. Which medications need to be refilled? (please list name of each medication and dose if known) potassium  2. Which pharmacy/location (including street and city if local pharmacy) is medication to be sent to?[facility]  3. Do they need a 30 day or 90 day supply? 90   Pt wants a paper copy if possible for rx!

## 2024-02-22 NOTE — Telephone Encounter (Addendum)
 Script printed & patient aware to pick up.

## 2024-03-20 ENCOUNTER — Other Ambulatory Visit: Payer: Self-pay | Admitting: Nurse Practitioner

## 2024-03-24 ENCOUNTER — Telehealth: Payer: Self-pay | Admitting: Cardiology

## 2024-03-24 MED ORDER — HYDRALAZINE HCL 25 MG PO TABS: 25.0000 mg | ORAL_TABLET | Freq: Two times a day (BID) | ORAL | 2 refills | Status: AC

## 2024-03-24 NOTE — Telephone Encounter (Signed)
*  STAT* If patient is at the pharmacy, call can be transferred to refill team.   1. Which medications need to be refilled? (please list name of each medication and dose if known) hydrALAZINE  (APRESOLINE ) 25 MG tablet    2. Would you like to learn more about the convenience, safety, & potential cost savings by using the [facility]? NO     3. Are you open to using the [facility] NO   4. Which pharmacy/location (including street and city if local pharmacy) is medication to be sent to?  [facility] - [place], [place] - [address]     5. Do they need a 30 day or 90 day supply? 90

## 2024-03-24 NOTE — Telephone Encounter (Signed)
 Pt's medication was sent to pt's pharmacy as requested. Confirmation received.

## 2024-03-26 ENCOUNTER — Other Ambulatory Visit: Payer: Self-pay | Admitting: Cardiology

## 2024-04-03 ENCOUNTER — Other Ambulatory Visit: Payer: Self-pay | Admitting: Cardiology

## 2024-04-09 ENCOUNTER — Encounter: Payer: Self-pay | Admitting: Nurse Practitioner

## 2024-04-09 ENCOUNTER — Ambulatory Visit: Admitting: Nurse Practitioner

## 2024-04-09 VITALS — BP 104/58 | HR 70 | Ht 68.5 in | Wt 207.6 lb

## 2024-04-09 DIAGNOSIS — Z794 Long term (current) use of insulin: Secondary | ICD-10-CM | POA: Diagnosis not present

## 2024-04-09 DIAGNOSIS — Z7984 Long term (current) use of oral hypoglycemic drugs: Secondary | ICD-10-CM | POA: Diagnosis not present

## 2024-04-09 DIAGNOSIS — E782 Mixed hyperlipidemia: Secondary | ICD-10-CM

## 2024-04-09 DIAGNOSIS — E559 Vitamin D deficiency, unspecified: Secondary | ICD-10-CM | POA: Diagnosis not present

## 2024-04-09 DIAGNOSIS — I10 Essential (primary) hypertension: Secondary | ICD-10-CM

## 2024-04-09 DIAGNOSIS — E1165 Type 2 diabetes mellitus with hyperglycemia: Secondary | ICD-10-CM

## 2024-04-09 LAB — POCT GLYCOSYLATED HEMOGLOBIN (HGB A1C): Hemoglobin A1C: 9.1 % — AB (ref 4.0–5.6)

## 2024-04-09 MED ORDER — DEXCOM G7 SENSOR MISC: 1.0000 | 3 refills | Status: AC

## 2024-04-09 NOTE — Progress Notes (Signed)
 Endocrinology Follow Up Note       04/09/2024, 3:29 PM   Subjective:    Patient ID: [place] Dennis, male    DOB: 07-Mar-1946.  [place] Dennis is being seen in follow up after being seen in consultation for management of currently uncontrolled symptomatic diabetes requested by  [place] Handing, MD.   Past Medical History:  Diagnosis Date   CHF (congestive heart failure) (HCC)    Diabetes mellitus without complication (HCC)    Hypertension    Kidney failure     Past Surgical History:  Procedure Laterality Date   CARDIOVERSION N/A 08/22/2021   Procedure: CARDIOVERSION;  Surgeon: [place] Powell BRAVO, MD;  Location: [facility] ENDOSCOPY;  Service: Cardiovascular;  Laterality: N/A;   RIGHT HEART CATH N/A 08/31/2022   Procedure: RIGHT HEART CATH;  Surgeon: [place] Lonni BIRCH, MD;  Location: [facility] INVASIVE CV LAB;  Service: Cardiovascular;  Laterality: N/A;    Social History   Socioeconomic History   Marital status: Married    Spouse name: Not on file   Number of children: Not on file   Years of education: Not on file   Highest education level: Not on file  Occupational History   Not on file  Tobacco Use   Smoking status: Never   Smokeless tobacco: Never  Vaping Use   Vaping status: Never Used  Substance and Sexual Activity   Alcohol use: Yes    Comment: [email or web address]   Drug use: Never   Sexual activity: Not on file  Other Topics Concern   Not on file  Social History Narrative   Not on file   Social Drivers of Health   Financial Resource Strain: Not on file  Food Insecurity: Not on file  Transportation Needs: Not on file  Physical Activity: Not on file  Stress: Not on file  Social Connections: Not on file    Family History  Problem Relation Age of Onset   Cancer Mother    Hypertension Father    Hypertension Brother     Outpatient Encounter Medications as of 04/09/2024  Medication Sig    acetaminophen  (TYLENOL ) 500 MG tablet Take 1,000 mg by mouth every 4 (four) hours as needed for mild pain.   amLODipine  (NORVASC ) 10 MG tablet Take 1 tablet (10 mg total) by mouth daily.   apixaban  (ELIQUIS ) 5 MG TABS tablet Take 1 tablet (5 mg total) by mouth 2 (two) times daily.   atorvastatin  (LIPITOR) 20 MG tablet Take 1 tablet (20 mg total) by mouth daily.   bisoprolol  (ZEBETA ) 5 MG tablet Take 1 tablet (5 mg total) by mouth daily.   Blood Glucose Monitoring Suppl (ACCU-CHEK GUIDE ME) w/Device KIT Use to check glucose twice daily   calcitRIOL (ROCALTROL) 0.25 MCG capsule Take 0.25 mcg by mouth 2 (two) times a week. Monday & Friday   FARXIGA  10 MG TABS tablet Take 10 mg by mouth daily.   Finerenone 10 MG TABS Take 10 mg by mouth daily.   glimepiride  (AMARYL ) 4 MG tablet Take 1 tablet (4 mg total) by mouth in the morning and at bedtime.   hydrALAZINE  (APRESOLINE ) 25 MG tablet Take 1 tablet (25 mg total) by mouth 2 (two)  times daily.   insulin  glargine, 2 Unit Dial, (TOUJEO  MAX SOLOSTAR) 300 UNIT/ML Solostar Pen Inject 30 Units into the skin at bedtime.   Insulin  Pen Needle (PEN NEEDLES) 31G X 5 MM MISC Use to inject insulin  once daily   lisinopril  (ZESTRIL ) 40 MG tablet Take 40 mg by mouth daily.   Multiple Vitamin (MULTIVITAMIN) tablet Take 1 tablet by mouth daily.   Polyvinyl Alcohol-Povidone (REFRESH OP) Place 1 drop into both eyes daily as needed (irritation).   potassium chloride  SA (KLOR-CON  M) 20 MEQ tablet Take 2 tablets (40 mEq total) by mouth daily.   torsemide  (DEMADEX ) 20 MG tablet take 3 tablets by mouth twice daily   [DISCONTINUED] Continuous Glucose Sensor (DEXCOM G7 SENSOR) MISC Inject 1 Application into the skin as directed. Change sensor every 10 days as directed.   Continuous Glucose Sensor (DEXCOM G7 SENSOR) MISC Inject 1 Application into the skin as directed. Change sensor every 10 days as directed.   Facility-Administered Encounter Medications as of 04/09/2024   Medication   sodium chloride  flush (NS) 0.9 % injection 3 mL    ALLERGIES: No Known Allergies  VACCINATION STATUS: Immunization History  Administered Date(s) Administered   Moderna Sars-Covid-2 Vaccination 07/14/2019, 08/11/2019    Diabetes He presents for his follow-up diabetic visit. He has type 2 diabetes mellitus. Onset time: diagnosed at approx age of 53. His disease course has been fluctuating. There are no hypoglycemic associated symptoms. Associated symptoms include fatigue and polydipsia. Pertinent negatives for diabetes include no foot ulcerations and no polyuria. There are no hypoglycemic complications. (Has had glucose drop into the 40s previously but does not get warning.) Symptoms are improving. Diabetic complications include heart disease (CHF), nephropathy and PVD. Risk factors for coronary artery disease include diabetes mellitus, dyslipidemia, male sex, hypertension and family history. Current diabetic treatment includes oral agent (dual therapy) and insulin  injections. He is compliant with treatment most of the time. His weight is fluctuating minimally. He is following a generally unhealthy diet. When asked about meal planning, he reported none. He has not had a previous visit with a dietitian. He participates in exercise intermittently. His home blood glucose trend is fluctuating dramatically. His breakfast blood glucose range is generally 90-110 mg/dl. His lunch blood glucose range is generally >200 mg/dl. His dinner blood glucose range is generally >200 mg/dl. His bedtime blood glucose range is generally >200 mg/dl. His overall blood glucose range is >200 mg/dl. (He presents today with his CGM showing tight fasting and above target postprandial readings.  His POCT A1c today is 9.1%, improving from last visit of 11.6%.  Analysis of his CGM shows TIR 25%, TAR 75% (51% in very high range), TBR <1% with a GMI of 9.3%.  He does eat 2 small sausage biscuits with his black coffee in  the morning which sends his sugar way up and takes a long time to reset between.  He did lower his Toujeo  slightly due to drops at night. He has been eating supper earlier in the evening and sometimes will have a snack before bed to prevent lows.) [place] ACE inhibitor/angiotensin II receptor blocker is being taken. He sees a podiatrist.Eye exam is current.     Review of systems  Constitutional: + stable body weight,  current Body mass index is 31.11 kg/m. , no fatigue, no subjective hyperthermia, no subjective hypothermia Eyes: no blurry vision, no xerophthalmia ENT: no sore throat, no nodules palpated in throat, no dysphagia/odynophagia, no hoarseness Cardiovascular: no chest pain, no shortness of  breath, no palpitations, no leg swelling Respiratory: no cough, no shortness of breath Gastrointestinal: no nausea/vomiting/diarrhea Musculoskeletal: no muscle/joint aches Skin: no rashes, no hyperemia Neurological: no tremors, no numbness, no tingling, no dizziness Psychiatric: no depression, no anxiety  Objective:     BP (!) 104/58 (BP Location: Left Arm, Patient Position: Sitting, Cuff Size: Large)   Pulse 70   Ht 5' 8.5 (1.74 m)   Wt 207 lb 9.6 oz (94.2 kg)   BMI 31.11 kg/m   Wt Readings from Last 3 Encounters:  04/09/24 207 lb 9.6 oz (94.2 kg)  01/08/24 208 lb 12.8 oz (94.7 kg)  11/07/23 204 lb 9.6 oz (92.8 kg)     BP Readings from Last 3 Encounters:  04/09/24 (!) 104/58  01/08/24 110/60  11/07/23 120/78      Physical Exam- Limited  Constitutional:  Body mass index is 31.11 kg/m. , not in acute distress, normal state of mind Eyes:  EOMI, no exophthalmos Musculoskeletal: no gross deformities, strength intact in all four extremities, no gross restriction of joint movements Skin:  no rashes, no hyperemia Neurological: no tremor with outstretched hands   Diabetic Foot Exam - Simple   No data filed      CMP ( most recent) CMP     Component Value Date/Time   NA 134  (L) 10/31/2022 1239   NA 137 08/29/2021 0949   K 3.4 (A) 12/26/2023 0000   CL 99 10/31/2022 1239   CO2 24 10/31/2022 1239   GLUCOSE 269 (H) 10/31/2022 1239   BUN 39 (A) 04/09/2023 0000   CREATININE 2.0 (A) 04/09/2023 0000   CREATININE 1.88 (H) 10/31/2022 1239   CALCIUM  9.1 10/31/2022 1239   PROT 7.8 10/31/2022 1239   PROT 7.0 08/29/2021 0949   ALBUMIN  4.0 10/31/2022 1239   ALBUMIN  4.3 08/29/2021 0949   AST 25 10/31/2022 1239   ALT 13 10/31/2022 1239   ALKPHOS 78 10/31/2022 1239   BILITOT 1.3 (H) 10/31/2022 1239   BILITOT 0.6 08/29/2021 0949   EGFR 44 12/26/2023 0000   EGFR 46 (L) 08/29/2021 0949   GFRNONAA 36 (L) 10/31/2022 1239     Diabetic Labs (most recent): Lab Results  Component Value Date   HGBA1C 9.1 (A) 04/09/2024   HGBA1C 11.6 12/18/2023   HGBA1C 11.4 (A) 10/03/2023     Lipid Panel ( most recent) Lipid Panel     Component Value Date/Time   CHOL 109 08/19/2021 0701   TRIG 162 (A) 04/09/2023 0000   HDL 47 08/19/2021 0701   CHOLHDL 2.3 08/19/2021 0701   VLDL 10 08/19/2021 0701   LDLCALC 50 12/18/2023 0000      Lab Results  Component Value Date   TSH 2.788 08/18/2021           Assessment & Plan:   1) Type 2 diabetes mellitus with hyperglycemia, without long-term current use of insulin  (HCC) (Primary)  He presents today with his CGM showing tight fasting and above target postprandial readings.  His POCT A1c today is 9.1%, improving from last visit of 11.6%.  Analysis of his CGM shows TIR 25%, TAR 75% (51% in very high range), TBR <1% with a GMI of 9.3%.  He does eat 2 small sausage biscuits with his black coffee in the morning which sends his sugar way up and takes a long time to reset between.  He did lower his Toujeo  slightly due to drops at night. He has been eating supper earlier in the evening and sometimes will  have a snack before bed to prevent lows.  - [place] Dennis has currently uncontrolled symptomatic type 2 DM since 78 years of age.    -Recent labs reviewed.  - I had a long discussion with him about the progressive nature of diabetes and the pathology behind its complications. -his diabetes is complicated by CKD, CHF, PVD and he remains at a high risk for more acute and chronic complications which include CAD, CVA, CKD, retinopathy, and neuropathy. These are all discussed in detail with him.  The following Lifestyle Medicine recommendations according to American College of Lifestyle Medicine [facility]) were discussed and offered to patient and he agrees to start the journey:  A. Whole Foods, Plant-based plate comprising of fruits and vegetables, plant-based proteins, whole-grain carbohydrates was discussed in detail with the patient.   A list for source of those nutrients were also provided to the patient.  Patient will use only water or unsweetened tea for hydration. B.  The need to stay away from risky substances including alcohol, smoking; obtaining 7 to 9 hours of restorative sleep, at least 150 minutes of moderate intensity exercise weekly, the importance of healthy social connections,  and stress reduction techniques were discussed. C.  A full color page of  Calorie density of various food groups per pound showing examples of each food groups was provided to the patient.  - Nutritional counseling repeated/built upon at each appointment.  - The patient admits there is a room for improvement in their diet and drink choices. -  Suggestion is made for the patient to avoid simple carbohydrates from their diet including Cakes, Sweet Desserts / Pastries, Ice Cream, Soda (diet and regular), Sweet Tea, Candies, Chips, Cookies, Sweet Pastries, Store Bought Juices, Alcohol in Excess of 1-2 drinks a day, Artificial Sweeteners, Coffee Creamer, and Sugar-free Products. This will help patient to have stable blood glucose profile and potentially avoid unintended weight gain.   - I encouraged the patient to switch to unprocessed or minimally  processed complex starch and increased protein intake (animal or plant source), fruits, and vegetables.   - Patient is advised to stick to a routine mealtimes to eat 3 meals a day and avoid unnecessary snacks (to snack only to correct hypoglycemia).   - I have approached him with the following individualized plan to manage his diabetes and patient agrees:   -He is advised to lower Toujeo  to 26 units SQ nightly (to avoid fasting hypoglycemia).  He can continue Farxiga  10 mg po daily and Glimepiride  4 mg po twice daily.  We did talk about several dietary adjustments to prevent escalation in regimen.  I advised him to adjust his breakfast to avoid high spikes which carries through the day.  -he is encouraged to start/continue monitoring glucose 2 times daily (using his CGM), before breakfast and before bed, and to call the clinic if he has readings less than 70 or above 300 for 3 tests in a row.  - he is warned not to take insulin  without proper monitoring per orders. - Adjustment parameters are given to him for hypo and hyperglycemia in writing.  - his Actos will be discontinued, risk outweighs benefit for this patient- can worsen heart failure. - he is not a candidate for Metformin due to concurrent renal insufficiency.  - he will be considered for incretin therapy as appropriate next visit.  - Specific targets for  A1c; LDL, HDL, and Triglycerides were discussed with the patient.  2) Blood Pressure /Hypertension:  his blood pressure  is controlled to target.   he is advised to continue his current medications as prescribed by PCP/cardiology/nephrology.  3) Lipids/Hyperlipidemia:    Review of his recent lipid panel from 12/18/23 showed controlled LDL at 50.  He is advised to continue Lipitor 20 mg po daily.   4)  Weight/Diet:  his Body mass index is 31.11 kg/m.  -  clearly complicating his diabetes care.   he is a candidate for weight loss. I discussed with him the fact that loss of 5 - 10%  of his  current body weight will have the most impact on his diabetes management.  Exercise, and detailed carbohydrates information provided  -  detailed on discharge instructions.  5) Chronic Care/Health Maintenance: -he is on ACEI/ARB and not Statin medications and is encouraged to initiate and continue to follow up with Ophthalmology, Dentist, Podiatrist at least yearly or according to recommendations, and advised to stay away from smoking. I have recommended yearly flu vaccine and pneumonia vaccine at least every 5 years; moderate intensity exercise for up to 150 minutes weekly; and sleep for at least 7 hours a day.  - he is advised to maintain close follow up with [place] Handing, MD for primary care needs, as well as his other providers for optimal and coordinated care.     I spent  26  minutes in the care of the patient today including review of labs from CMP, Lipids, Thyroid Function, Hematology (current and previous including abstractions from other facilities); face-to-face time discussing  his blood glucose readings/logs, discussing hypoglycemia and hyperglycemia episodes and symptoms, medications doses, his options of short and long term treatment based on the latest standards of care / guidelines;  discussion about incorporating lifestyle medicine;  and documenting the encounter. Risk reduction counseling performed per USPSTF guidelines to reduce obesity and cardiovascular risk factors.     Please refer to Patient Instructions for Blood Glucose Monitoring and Insulin /Medications Dosing Guide  in media tab for additional information. Please  also refer to  Patient Self Inventory in the Media  tab for reviewed elements of pertinent patient history.  [place] Dennis participated in the discussions, expressed understanding, and voiced agreement with the above plans.  All questions were answered to his satisfaction. he is encouraged to contact clinic should he have any questions or concerns  prior to his return visit.     Follow up plan: - Return in about 4 months (around 08/08/2024) for Diabetes F/U with A1c in office, No previsit labs, Bring meter and logs.  Benton Rio, [facility] [address] [place], [place] [address] Phone: [phone] Fax: [phone]  04/09/2024, 3:29 PM

## 2024-04-17 LAB — LAB REPORT - SCANNED
Creatinine, POC: 49 mg/dL
EGFR: 36

## 2024-05-08 ENCOUNTER — Other Ambulatory Visit: Payer: Self-pay | Admitting: Cardiology

## 2024-05-13 ENCOUNTER — Encounter: Payer: Self-pay | Admitting: Podiatry

## 2024-05-13 ENCOUNTER — Ambulatory Visit (INDEPENDENT_AMBULATORY_CARE_PROVIDER_SITE_OTHER): Admitting: Podiatry

## 2024-05-13 DIAGNOSIS — I739 Peripheral vascular disease, unspecified: Secondary | ICD-10-CM

## 2024-05-13 DIAGNOSIS — M205X2 Other deformities of toe(s) (acquired), left foot: Secondary | ICD-10-CM | POA: Diagnosis not present

## 2024-05-13 DIAGNOSIS — B351 Tinea unguium: Secondary | ICD-10-CM

## 2024-05-13 DIAGNOSIS — M79674 Pain in right toe(s): Secondary | ICD-10-CM | POA: Diagnosis not present

## 2024-05-13 DIAGNOSIS — M79675 Pain in left toe(s): Secondary | ICD-10-CM | POA: Diagnosis not present

## 2024-05-13 DIAGNOSIS — E1159 Type 2 diabetes mellitus with other circulatory complications: Secondary | ICD-10-CM

## 2024-05-13 DIAGNOSIS — M205X1 Other deformities of toe(s) (acquired), right foot: Secondary | ICD-10-CM | POA: Diagnosis not present

## 2024-05-13 NOTE — Progress Notes (Signed)
 This patient presents to the office with chief complaint of long thick nails and diabetic feet.  This patient  says there  is  no pain and discomfort in his  feet.  This patient says there are long thick painful nails.  These nails are painful walking and wearing shoes.    Patient is unable to  self treat his own nails . 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  This patient presents  to the office today for treatment of the  long nails   General Appearance  Alert, conversant and in no acute stress.  Vascular  Dorsalis pedis and posterior tibial  pulses are absent  bilaterally.  Capillary return is within normal limits  bilaterally. Temperature is within normal limits  bilaterally.  Neurologic  Senn-Weinstein monofilament wire test within normal limits  bilaterally. Muscle power within normal limits bilaterally.  Nails Thick disfigured discolored nails with subungual debris  from hallux to fifth toes bilaterally. No evidence of bacterial infection or drainage bilaterally.  Orthopedic  No limitations of motion of motion feet .  No crepitus or effusions noted.  No bony pathology or digital deformities noted.  Hallux limitus 1st MPJ  B/L.  Skin  normotropic skin with no porokeratosis noted bilaterally.  No signs of infections or ulcers noted.     Onychomycosis  Diabetes with no foot complications  Debride nails x 10. With nail nipper and dremel tool.    RTC 3 months.   Ruffin Cotton DPM

## 2024-05-22 ENCOUNTER — Telehealth: Payer: Self-pay | Admitting: Cardiology

## 2024-05-22 NOTE — Telephone Encounter (Signed)
 Jori called on behalf of pt stating she's faxed over medical clearance and would like a c/b regarding this matter. Please advise.

## 2024-05-22 NOTE — Telephone Encounter (Signed)
 I returned a call to [facility] at [facility] who states she faxed the clearance request a few times to multiple fax#'s, including the main fax# [phone]. I apologized for the inconvenience. I did tell he the only fax she needs for any preop clearance is the fax# [phone] attn: preop team.   As she has been trying to get the information to us  is took the preop information by phone. Jori, did also d/w me the concern the [facility] has when getting back our preop notes as they are felt to be generic. Jori stated the [facility] does like to have their form filled out.   I did explain our process and protocol as well. I explained that we have 7 satellite office locations for our cardiologist , which is a large # of cardiologist we are managing preop clearances for. Our preop team designed about 7 yrs ago a format/template for uniformity and consistency to be sure ALL request from any surgeon office are placed in the same format. I did say that at any time if there is something specific the [facility] wants address to be sure to let us  know of thew clearance form. I stated that our team always strives to provide the best care for our pt's and community.      Pre-operative Risk Assessment    Patient Name: [place] Dennis  DOB: Sep 02, 1945 MRN: [id number]   Date of last office visit: 11/07/23 DR. BRANCH Date of next office visit: NONE  Request for Surgical Clearance    Procedure:  Dental Extraction - Amount of Teeth to be Pulled:  2 EXTRACTIONS AT THIS TIME THOUGH NOT KNOWN UNTIL PROCEDURE IF THEY WILL BE SIMPLE OR SURGICAL EXTRACTIONS  Date of Surgery:  Clearance TBD                                Surgeon:  DR. VENETIA GELINEAU, [facility] Surgeon's Group or Practice Name:  [facility] Phone number:  [phone] Fax number:  [phone] ATTN: CONNIE   Type of Clearance Requested:   - Medical  - Pharmacy:  Hold Apixaban  (Eliquis )   WILL PT NEED SBE?    Type of Anesthesia:  Local    Additional  requests/questions:    Signed, Arieana Somoza   05/22/2024, 12:34 PM

## 2024-05-22 NOTE — Telephone Encounter (Signed)
 Please advise holding Eliquis  prior to Dental Extraction - Amount of Teeth to be Pulled:  2 EXTRACTIONS AT THIS TIME THOUGH NOT KNOWN UNTIL PROCEDURE IF THEY WILL BE SIMPLE OR SURGICAL EXTRACTIONS  Last labs 11/2023.  Thank you!  AW

## 2024-05-23 NOTE — Telephone Encounter (Signed)
 Hey there you said please advise holding Eliquis . What do you mean do you need me to forward this to pharm D or call the requesting office about Eliquis . Please advise.

## 2024-05-26 NOTE — Telephone Encounter (Signed)
 Patient with diagnosis of afib on Eliquis  for anticoagulation.    Procedure: 2 EXTRACTIONS AT THIS TIME THOUGH NOT KNOWN UNTIL PROCEDURE IF THEY WILL BE SIMPLE OR SURGICAL EXTRACTIONS  Date of procedure: TBD   CHA2DS2-VASc Score = 6   This indicates a 9.7% annual risk of stroke. The patient's score is based upon: CHF History: 1 HTN History: 1 Diabetes History: 1 Stroke History: 0 Vascular Disease History: 1 Age Score: 2 Gender Score: 0      CrCl 36 ml/min Platelet count 224  Patient has not had an Afib/aflutter ablation in the last 3 months, DCCV within the last 4 weeks or a watchman implanted in the last 45 days   Patient does not require pre-op antibiotics for dental procedure.  Per office protocol, patient can hold Eliquis  for 1 day prior to procedure.    **This guidance is not considered finalized until pre-operative APP has relayed final recommendations.**

## 2024-05-27 NOTE — Telephone Encounter (Signed)
 Left message for the pt to schedule appt in the office for preop clearance.

## 2024-05-27 NOTE — Telephone Encounter (Signed)
" ° °  Name: [place] Dennis  DOB: Apr 25, 1946  MRN: [id number]  Primary Cardiologist: Alvan Carrier, MD  Chart reviewed as part of pre-operative protocol coverage. This patient lives in [place] . Therefore, he is not eligible for telephone visit and will require a follow-up in-office visit in order to better assess preoperative cardiovascular risk.  Pre-op covering staff: - Please schedule appointment and call patient to inform them. If patient already had an upcoming appointment within acceptable timeframe, please add pre-op clearance to the appointment notes so provider is aware. - Please contact requesting surgeon's office via preferred method (i.e, phone, fax) to inform them of need for appointment prior to surgery.  PharmD recommendations: Patient does not require pre-op antibiotics for dental procedure. Per office protocol, patient can hold Eliquis  for 1 day prior to procedure.    Glendia Ferrier, PA-C  05/27/2024, 10:28 AM  "

## 2024-05-29 NOTE — Telephone Encounter (Signed)
 Jori with [facility] office called to inquire if pt has been cleared. I stated the pt will need an appt in the office for preop clearance. I stated that we left a message for the pt 05/27/24 to call the office to schedule an appt.

## 2024-05-29 NOTE — Telephone Encounter (Addendum)
 I s/w the pt about needing preop clearance for dental procedure. Pt tells me the dental procedure is not going to be done until July 2026. If procedure not until July 2026, we should really have a new dental preop request faxed to our office [phone], as this is more than 2 months out.   Pt states he will call back and schedule an appt with Dr. Alvan or APP for preop clearance.   Pt tells me that he is needing samples of Eliquis  as he states he is going to run our before his next Rx comes in. I assured the pt that I will send a message to Dr. Ranae nurse about samples. Pt said thank you.

## 2024-05-30 NOTE — Telephone Encounter (Signed)
 No answer

## 2024-06-04 NOTE — Telephone Encounter (Signed)
 Left message to return call

## 2024-06-05 ENCOUNTER — Other Ambulatory Visit: Payer: Self-pay | Admitting: Cardiology

## 2024-08-11 ENCOUNTER — Ambulatory Visit: Admitting: Nurse Practitioner

## 2024-08-19 ENCOUNTER — Ambulatory Visit: Admitting: Podiatry
